# Patient Record
Sex: Female | Born: 1960 | Race: White | Hispanic: Yes | Marital: Married | Smoking: Former smoker
Health system: Southern US, Community
[De-identification: ages and names within clinical notes are randomized; demographics above are authoritative.]

## PROBLEM LIST (undated history)

## (undated) DIAGNOSIS — H409 Unspecified glaucoma: Secondary | ICD-10-CM

## (undated) DIAGNOSIS — I10 Essential (primary) hypertension: Secondary | ICD-10-CM

## (undated) DIAGNOSIS — E119 Type 2 diabetes mellitus without complications: Secondary | ICD-10-CM

## (undated) DIAGNOSIS — M1990 Unspecified osteoarthritis, unspecified site: Secondary | ICD-10-CM

## (undated) DIAGNOSIS — N939 Abnormal uterine and vaginal bleeding, unspecified: Secondary | ICD-10-CM

## (undated) DIAGNOSIS — C55 Malignant neoplasm of uterus, part unspecified: Secondary | ICD-10-CM

## (undated) HISTORY — DX: Essential (primary) hypertension: I10

## (undated) HISTORY — DX: Unspecified glaucoma: H40.9

## (undated) HISTORY — PX: BREAST BIOPSY: SHX20

## (undated) HISTORY — DX: Type 2 diabetes mellitus without complications: E11.9

## (undated) HISTORY — PX: HYSTEROSCOPY W/ POLYPECTOMY: SHX1775

## (undated) HISTORY — PX: OTHER PROCEDURE: U1053

## (undated) HISTORY — PX: COLONOSCOPY: SHX174

## (undated) HISTORY — DX: Abnormal uterine and vaginal bleeding, unspecified: N93.9

## (undated) HISTORY — PX: INCISIONAL BREAST BIOPSY: SHX1812

## (undated) HISTORY — DX: Type 2 diabetes mellitus without complications (CMS-HCC): E11.9

## (undated) HISTORY — DX: Unspecified osteoarthritis, unspecified site: M19.90

## (undated) HISTORY — DX: Malignant neoplasm of uterus, part unspecified (CMS-HCC): C55

## (undated) MED ORDER — METFORMIN HCL 500 MG OR TB24
500.00 mg | ORAL_TABLET | Freq: Every day | ORAL | 0 refills | Status: AC
Start: 2018-10-08 — End: ?

## (undated) MED ORDER — LISINOPRIL-HYDROCHLOROTHIAZIDE 20-25 MG OR TABS
1.0000 | ORAL_TABLET | Freq: Every day | ORAL | 2 refills | Status: AC
Start: 2019-04-09 — End: ?

---

## 2016-08-14 ENCOUNTER — Ambulatory Visit (INDEPENDENT_AMBULATORY_CARE_PROVIDER_SITE_OTHER): Admitting: Family Practice

## 2016-08-18 ENCOUNTER — Ambulatory Visit (INDEPENDENT_AMBULATORY_CARE_PROVIDER_SITE_OTHER): Admitting: Medical

## 2016-08-18 ENCOUNTER — Ambulatory Visit (INDEPENDENT_AMBULATORY_CARE_PROVIDER_SITE_OTHER): Admitting: Family

## 2016-08-18 ENCOUNTER — Ambulatory Visit (INDEPENDENT_AMBULATORY_CARE_PROVIDER_SITE_OTHER): Admitting: Family Practice

## 2016-08-18 ENCOUNTER — Encounter (INDEPENDENT_AMBULATORY_CARE_PROVIDER_SITE_OTHER): Payer: Self-pay | Admitting: Family

## 2016-08-18 VITALS — BP 132/82 | HR 72 | Temp 97.9°F | Resp 16 | Ht 61.5 in | Wt 268.2 lb

## 2016-08-18 DIAGNOSIS — Z13228 Encounter for screening for other metabolic disorders: Secondary | ICD-10-CM

## 2016-08-18 DIAGNOSIS — Z1211 Encounter for screening for malignant neoplasm of colon: Principal | ICD-10-CM

## 2016-08-18 DIAGNOSIS — I10 Essential (primary) hypertension: Secondary | ICD-10-CM

## 2016-08-18 LAB — CBC WITH DIFF, BLOOD
Abs Basophils: 41 cells/uL (ref 0–200)
Abs Eosinophils: 143 cells/uL (ref 15–500)
Abs Lymphs: 2285 cells/uL (ref 850–3900)
Abs Monocytes: 704 cells/uL (ref 200–950)
Abs NRBC: 0 cells/uL
Abs Neutrophils: 7028 cells/uL (ref 1500–7800)
Basophils: 0.4 %
Eosinophils: 1.4 %
HCT: 41.8 % (ref 35.0–45.0)
HGB: 14.2 g/dL (ref 11.7–15.5)
Lymps: 22.4 %
MCH: 28.9 pg (ref 27.0–33.0)
MCHC: 34 g/dL (ref 32.0–36.0)
MCV: 85 fL (ref 80.0–100.0)
MPV: 11.7 fL (ref 7.5–12.5)
Monocytes: 6.9 %
PLT: 229 10*3/uL (ref 140–400)
RBC: 4.92 10*6/uL (ref 3.80–5.10)
RDW: 12.5 % (ref 11.0–15.0)
SEGS: 68.9 %
WBC: 10.2 10*3/uL (ref 3.8–10.8)

## 2016-08-18 LAB — COMPREHENSIVE METABOLIC PANEL, BLOOD
ALT (SGPT): 43 U/L — ABNORMAL HIGH (ref 6–29)
AST (SGOT): 34 U/L (ref 10–35)
Albumin/Glob Ratio: 1.4 (calc) (ref 1.0–2.5)
Albumin: 4.1 g/dL (ref 3.6–5.1)
Alkaline Phos: 66 U/L (ref 33–130)
BUN: 9 mg/dL (ref 7–25)
Bilirubin, Total: 0.6 mg/dL (ref 0.2–1.2)
Calcium: 9.4 mg/dL (ref 8.6–10.4)
Carbon Dioxide: 30 mmol/L (ref 20–31)
Chloride: 102 mmol/L (ref 98–110)
Creatinine: 0.67 mg/dL (ref 0.50–1.05)
Globulin: 2.9 g/dL (calc) (ref 1.9–3.7)
Glucose: 244 mg/dL — ABNORMAL HIGH (ref 65–99)
Potassium: 4 mmol/L (ref 3.5–5.3)
Sodium: 139 mmol/L (ref 135–146)
Total Protein: 7 g/dL (ref 6.1–8.1)
eGFR African American: 115 mL/min/{1.73_m2} (ref >=60)
eGFR non-Afr.American: 99 mL/min/{1.73_m2} (ref >=60)

## 2016-08-18 LAB — LIPID(CHOL FRACT) PANEL, BLOOD
Chol/HDLC Ratio: 3.2 (calc) (ref <5.0)
Cholesterol: 145 mg/dL (ref <200)
HDL Cholesterol: 45 mg/dL — ABNORMAL LOW (ref >50)
LDL-Cholesterol: 84 mg/dL (calc)
Non-HDL Cholesterol: 100 mg/dL (calc) (ref <130)
Triglycerides: 70 mg/dL (ref <150)

## 2016-08-18 MED ORDER — LISINOPRIL-HYDROCHLOROTHIAZIDE 20-25 MG OR TABS
1.0000 | ORAL_TABLET | Freq: Every day | ORAL | 5 refills | Status: DC
Start: 2016-08-18 — End: 2017-02-08

## 2016-08-18 MED ORDER — LISINOPRIL-HYDROCHLOROTHIAZIDE 20-25 MG OR TABS
ORAL_TABLET | ORAL | 2 refills | Status: DC
Start: 2016-07-15 — End: 2016-08-18

## 2016-08-19 ENCOUNTER — Encounter (INDEPENDENT_AMBULATORY_CARE_PROVIDER_SITE_OTHER): Payer: Self-pay | Admitting: Family

## 2016-08-19 DIAGNOSIS — R899 Unspecified abnormal finding in specimens from other organs, systems and tissues: Principal | ICD-10-CM

## 2016-08-21 ENCOUNTER — Telehealth (INDEPENDENT_AMBULATORY_CARE_PROVIDER_SITE_OTHER): Payer: Self-pay | Admitting: Family

## 2016-08-29 ENCOUNTER — Ambulatory Visit (INDEPENDENT_AMBULATORY_CARE_PROVIDER_SITE_OTHER): Admitting: Family Medicine

## 2016-09-02 LAB — COMPREHENSIVE METABOLIC PANEL, BLOOD
ALT (SGPT): 60 U/L — ABNORMAL HIGH (ref 6–29)
AST (SGOT): 53 U/L — ABNORMAL HIGH (ref 10–35)
Albumin/Glob Ratio: 1.5 (calc) (ref 1.0–2.5)
Albumin: 4.3 g/dL (ref 3.6–5.1)
Alkaline Phos: 60 U/L (ref 33–130)
BUN: 12 mg/dL (ref 7–25)
Bilirubin, Total: 0.8 mg/dL (ref 0.2–1.2)
Calcium: 9.6 mg/dL (ref 8.6–10.4)
Carbon Dioxide: 26 mmol/L (ref 20–32)
Chloride: 99 mmol/L (ref 98–110)
Creatinine: 0.66 mg/dL (ref 0.50–1.05)
Globulin: 2.9 g/dL (calc) (ref 1.9–3.7)
Glucose: 146 mg/dL — ABNORMAL HIGH (ref 65–99)
Potassium: 4.1 mmol/L (ref 3.5–5.3)
Sodium: 137 mmol/L (ref 135–146)
Total Protein: 7.2 g/dL (ref 6.1–8.1)
eGFR African American: 115 mL/min/{1.73_m2} (ref >=60)
eGFR non-Afr.American: 99 mL/min/{1.73_m2} (ref >=60)

## 2016-09-02 LAB — GLYCOSYLATED HGB(A1C), BLOOD: Hgb A1C: 9.3 % of total Hgb — ABNORMAL HIGH (ref <5.7)

## 2016-09-02 LAB — RANDOM URINE MICROALBUMIN: Microalbumin Random: 1 mg/dL

## 2016-09-02 LAB — STOOL IMMUNOCHEMICAL OCCULT BLOOD

## 2016-09-04 ENCOUNTER — Telehealth (INDEPENDENT_AMBULATORY_CARE_PROVIDER_SITE_OTHER): Payer: Self-pay | Admitting: Family

## 2016-09-05 ENCOUNTER — Telehealth (INDEPENDENT_AMBULATORY_CARE_PROVIDER_SITE_OTHER): Payer: Self-pay | Admitting: Family

## 2016-09-12 ENCOUNTER — Ambulatory Visit (INDEPENDENT_AMBULATORY_CARE_PROVIDER_SITE_OTHER): Admitting: Family

## 2016-09-16 ENCOUNTER — Ambulatory Visit (INDEPENDENT_AMBULATORY_CARE_PROVIDER_SITE_OTHER): Admitting: Family

## 2016-09-16 VITALS — BP 142/78 | HR 78 | Temp 98.4°F | Resp 16 | Ht 61.5 in | Wt 261.0 lb

## 2016-09-16 DIAGNOSIS — E119 Type 2 diabetes mellitus without complications: Principal | ICD-10-CM

## 2016-09-16 DIAGNOSIS — I10 Essential (primary) hypertension: Secondary | ICD-10-CM

## 2016-09-16 MED ORDER — METFORMIN HCL 500 MG OR TABS
500.0000 mg | ORAL_TABLET | Freq: Two times a day (BID) | ORAL | 5 refills | Status: DC
Start: 2016-09-16 — End: 2016-12-22

## 2016-09-16 NOTE — Progress Notes (Signed)
Encounter Date:  09/16/16  10:10 AM   PCP: Michaelle Copas  MRN: [id number]        HPI:  Kathleen May   is a 56 year old year old (10-Oct-1960)  female who presents  for the following:   Chief Complaint   Patient presents with   . Results     discuss lab results           PROBLEM  LIST:  Patient Active Problem List   Diagnosis   . Essential hypertension   . Type 2 diabetes mellitus without complication, without long-term current use of insulin (CMS-HCC)   . Type 2 diabetes mellitus without complication (CMS-HCC)         PAST MEDICAL HISTORY:  Past Medical History:   Diagnosis Date   . Arthritis    . Hypertension        PAST SURGICAL HISTORY:  Past Surgical History:   Procedure Laterality Date   . CESAREAN SECTION, CLASSIC  1982    2nd 1989        FAMILY HISTORY:   Family History   Problem Relation Age of Onset   . Hypertension Mother    . Diabetes Mother    . Other Mother    . Liver Disease Father    . Cancer Father    . Cancer Maternal Grandmother    . Cancer Maternal Aunt          SOCIAL HISTORY:  Social History     Social History   . Marital status: Married     Spouse name: N/A   . Number of children: N/A   . Years of education: N/A     Occupational History   . Not on file.     Social History Main Topics   . Smoking status: Former Smoker     Packs/day: 2.00     Quit date: 2008   . Smokeless tobacco: Never Used   . Alcohol use No   . Drug use: Not on file   . Sexual activity: Not on file     Other Topics Concern   . Not on file     Social History Narrative   . No narrative on file     History   Smoking Status   . Former Smoker   . Packs/day: 2.00   . Quit date: 2008   Smokeless Tobacco   . Never Used      History   Alcohol Use No      History   Drug Use Not on file          There is no immunization history on file for this patient.      CURRENT  MEDICATIONS:  Current Outpatient Prescriptions on File Prior to Visit   Medication Sig Dispense Refill   . lisinopril-hydrochlorothiazide (ZESTORETIC) 20-25 MG tablet Take  1 tablet by mouth daily. 30 tablet 5     No current facility-administered medications on file prior to visit.      No outpatient prescriptions have been marked as taking for the 09/16/16 encounter (Office Visit) with Cecille Amsterdam, NP.        ALLERGIES:    Allergies   Allergen Reactions   . Codeine Other          REVIEW OF SYSTEMS:  Review of Systems   Constitutional: Negative.  Negative for activity change, chills and fever.   HENT: Negative.  Negative for congestion and trouble swallowing.  Eyes: Negative.  Negative for visual disturbance.   Respiratory: Negative for cough and shortness of breath.    Cardiovascular: Negative for chest pain.   Gastrointestinal: Negative for abdominal pain.   Endocrine: Negative for cold intolerance.   Genitourinary: Negative for dysuria.   Musculoskeletal: Negative for arthralgias.   Skin: Negative for rash.   Neurological: Negative for speech difficulty.   Hematological: Negative for adenopathy.   Psychiatric/Behavioral: Negative for behavioral problems.         PHYSICAL EXAM:   09/16/16  0956   BP: 142/78   Pulse: 78   Resp: 16   Temp: 98.4 F (36.9 C)     Body mass index is 48.52 kg/(m^2).   Height: 5' 1.5" (156.2 cm)   Weight: 118.4 kg (261 lb)     Physical Exam   Constitutional: She is oriented to person, place, and time. She appears well-developed and well-nourished.   Cardiovascular: Normal rate, regular rhythm and normal heart sounds.    Pulmonary/Chest: Effort normal.   Neurological: She is alert and oriented to person, place, and time.   Psychiatric: She has a normal mood and affect. Her behavior is normal.           ASSESSMENT & PLAN:    Type 2 diabetes mellitus without complication, without long-term current use of insulin (CMS-HCC)  Stable  Patient must continue aggressive control of blood sugar and blood pressure by taking current meds as listed above  Continue to monitor and f/u for blood work  Patient was counseled regarding the need to increase exercise and to  monitor diet:   Consider low carbohydrate or Paleo/Keto diet.   Lab Results   Component Value Date    A1C 9.3 (H) 08/29/2016       Essential hypertension   Stable   Take meds as directed   Consider checking  blood pressure at home  regularly and record progression in a journal   Increase exercise and to monitor diet   F/UP IN 4-6 months or as directed        ORDERS PLACED DURING VISITS  [name] had no medications administered during this visit.  Orders Placed This Encounter   Procedures   . Comprehensive Metabolic Panel Green   . Glycosylated Hgb(A1C), Blood Lavender   . Random Urine Microalbumin w/o Creatinine     No orders of the defined types were placed in this encounter.    No orders of the defined types were placed in this encounter.              ICD-10 codes:    ICD-10-CM ICD-9-CM    1. Type 2 diabetes mellitus without complication, without long-term current use of insulin (CMS-HCC) E11.9 250.00 metFORMIN (GLUCOPHAGE) 500 MG tablet      Comprehensive Metabolic Panel Green      Glycosylated Hgb(A1C), Blood Lavender      Random Urine Microalbumin w/o Creatinine      Consult/Referral to Ophthalmology Clinic   2. Essential hypertension I10 401.9            Cecille Amsterdam, NP  [facility]  [email or web address]

## 2016-09-16 NOTE — Assessment & Plan Note (Signed)
Stable  Patient must continue aggressive control of blood sugar and blood pressure by taking current meds as listed above  Continue to monitor and f/u for blood work  Patient was counseled regarding the need to increase exercise and to monitor diet:   Consider low carbohydrate or Paleo/Keto diet.   Lab Results   Component Value Date    A1C 9.3 (H) 08/29/2016

## 2016-09-17 NOTE — Assessment & Plan Note (Signed)
Stable  Take meds as directed  Consider checking  blood pressure at home  regularly and record progression in a journal  Increase exercise and to monitor diet  F/UP IN 4-6 months or as directed

## 2016-10-04 ENCOUNTER — Ambulatory Visit (INDEPENDENT_AMBULATORY_CARE_PROVIDER_SITE_OTHER): Admitting: Family Medicine

## 2016-11-07 ENCOUNTER — Ambulatory Visit (INDEPENDENT_AMBULATORY_CARE_PROVIDER_SITE_OTHER): Admitting: Family Medicine

## 2016-11-07 DIAGNOSIS — Z23 Encounter for immunization: Principal | ICD-10-CM

## 2016-11-08 ENCOUNTER — Encounter (INDEPENDENT_AMBULATORY_CARE_PROVIDER_SITE_OTHER): Payer: Self-pay

## 2016-12-19 LAB — COMPREHENSIVE METABOLIC PANEL, BLOOD
ALT (SGPT): 17 U/L (ref 6–29)
AST (SGOT): 22 U/L (ref 10–35)
Albumin/Glob Ratio: 1.4 (calc) (ref 1.0–2.5)
Albumin: 4.4 g/dL (ref 3.6–5.1)
Alkaline Phos: 67 U/L (ref 33–130)
BUN: 17 mg/dL (ref 7–25)
Bilirubin, Total: 0.6 mg/dL (ref 0.2–1.2)
Calcium: 9.8 mg/dL (ref 8.6–10.4)
Carbon Dioxide: 31 mmol/L (ref 20–32)
Chloride: 103 mmol/L (ref 98–110)
Creatinine: 0.61 mg/dL (ref 0.50–1.05)
Globulin: 3.2 g/dL (calc) (ref 1.9–3.7)
Glucose: 98 mg/dL (ref 65–99)
Potassium: 4.1 mmol/L (ref 3.5–5.3)
Sodium: 142 mmol/L (ref 135–146)
Total Protein: 7.6 g/dL (ref 6.1–8.1)
eGFR African American: 118 mL/min/{1.73_m2} (ref >=60)
eGFR non-Afr.American: 102 mL/min/{1.73_m2} (ref >=60)

## 2016-12-19 LAB — GLYCOSYLATED HGB(A1C), BLOOD: Hgb A1C: 5.2 % of total Hgb (ref <5.7)

## 2016-12-19 LAB — RANDOM URINE MICROALBUMIN: Microalbumin Random: 1.2 mg/dL

## 2016-12-20 ENCOUNTER — Encounter (INDEPENDENT_AMBULATORY_CARE_PROVIDER_SITE_OTHER): Payer: Self-pay | Admitting: Family

## 2016-12-21 ENCOUNTER — Encounter (INDEPENDENT_AMBULATORY_CARE_PROVIDER_SITE_OTHER): Payer: Self-pay | Admitting: Family

## 2016-12-22 ENCOUNTER — Ambulatory Visit (INDEPENDENT_AMBULATORY_CARE_PROVIDER_SITE_OTHER): Admitting: Family

## 2016-12-22 ENCOUNTER — Encounter (INDEPENDENT_AMBULATORY_CARE_PROVIDER_SITE_OTHER): Payer: Self-pay | Admitting: Physician Assistant

## 2016-12-22 ENCOUNTER — Ambulatory Visit (INDEPENDENT_AMBULATORY_CARE_PROVIDER_SITE_OTHER): Admitting: Physician Assistant

## 2016-12-22 VITALS — BP 138/76 | HR 74 | Temp 98.0°F | Resp 16 | Ht 61.0 in | Wt 241.0 lb

## 2016-12-22 DIAGNOSIS — E119 Type 2 diabetes mellitus without complications: Secondary | ICD-10-CM

## 2016-12-22 DIAGNOSIS — I10 Essential (primary) hypertension: Secondary | ICD-10-CM

## 2016-12-22 DIAGNOSIS — Z712 Person consulting for explanation of examination or test findings: Principal | ICD-10-CM

## 2016-12-22 DIAGNOSIS — M2550 Pain in unspecified joint: Secondary | ICD-10-CM

## 2016-12-22 DIAGNOSIS — L853 Xerosis cutis: Secondary | ICD-10-CM

## 2016-12-22 MED ORDER — METFORMIN HCL 500 MG OR TB24
500.0000 mg | ORAL_TABLET | Freq: Every day | ORAL | 1 refills | Status: DC
Start: 2016-12-22 — End: 2017-07-12

## 2016-12-22 NOTE — Assessment & Plan Note (Signed)
Chronic, worsening since starting to exercise on the treadmill the last few months.  Pt can continue Ibuprofen as needed. Can alternate with Tylenol. Discussed negative side effects of using these medications in excess.  Also recommend adding Turmeric and Glucosamine.  Recommend different exercises such as swimming, biking or elliptical which are easier on the joints.

## 2016-12-22 NOTE — Assessment & Plan Note (Addendum)
Active x a couple of months.  Pt can continue Lanolin cream if this helps.  Also recommended trying OTC Hydrocortisone cream for the next 1-2 weeks. Discussed negative side effects of long term corticosteroid use.    F/u if symptoms worsen or do not improve.

## 2016-12-22 NOTE — Assessment & Plan Note (Signed)
Stable.  Patient must continue aggressive control of blood sugar and blood pressure by taking current meds as listed above.  Continue to monitor and f/u for blood work: CMP, HgbA1C and microalbumin ordered and to be completed in 6 months.   Patient was counseled regarding the need to increase exercise and to monitor diet:   Continue paleo diet.   08/29/2016:  HgbA1C: 9.3  Glucose: 146    12/18/2016:  HgbA1C: 5.2  Glucose: 98  Improved. Pt requests lowering Metformin dose as she wants to minimize the meds she is on. Prescribe Metformin XR to be taken once daily. Will recheck labs in 6 months.   Recommend monitoring blood sugar at home.  F/u with any concerns.

## 2016-12-22 NOTE — Assessment & Plan Note (Addendum)
Discussed most recent labs with pt in detail, compared to labs on 08/30/2106, showing improvement.  12/18/2016:  HgbA1C: 5.2  Glucose: 98  AST: 22  ALT: 17

## 2016-12-22 NOTE — Assessment & Plan Note (Signed)
138/76 in clinic today. Pt has not yet taken her Lisinopril-HCTZ.  Continue medication as prescribed.  Pt wants to eventually wean off this medication.  Pt instructed to check blood pressure at home for the next 1 week and record progression in a journal. If BP remains low, can call clinic and consider lowering med dose.   Increase exercise and monitor diet.  F/up as directed.

## 2016-12-22 NOTE — Progress Notes (Signed)
Encounter Date:  12/22/16  10:24 AM   PCP: Michaelle Copas  MRN: [id number]    HPI:  Kathleen May is a 56 year old female who presents to discuss lab results. Pt had blood work done on 08/29/2016 and was seen in clinic on 09/16/2016 at which time she was diagnosed with DM and started on 500 mg po bid Metformin.   08/29/2016:  HgbA1C: 9.3  Glucose: 146  AST: 53  ALT: 60  She endorses experiencing constipation then diarrhea for the first 2 weeks after starting the medications but has not had any problems since then. She has been doing the [employer] and exercising on the treadmill since August 2018 and reports weight loss secondary to the same. She requests to lower her dose of Metformin.   Pt had repeat lab work recently.   12/18/2016:  HgbA1C: 5.2  Glucose: 98  AST: 22  ALT: 17  Pt has been compliant with her BP medication Lisinopril-HCTZ. Pt has not yet taken her BP meds this morning. She has not been monitoring her BP at home. She requests to lower the dose and eventually wants to wean off. Pt endorses nocturia secondary to the medication.     Pt further reports dry skin, especially on her fingers, for the last few months for which she uses Lanolin cream.  Pt has also been experiencing increased joint pain, especially involving her knees, for the last few months. Pt takes 800 mg Ibuprofen as needed for the same. She does endorse a hx of arthritis in her knee diagnosed via x-ray.  Pt denies fever, chills, fatigue, shortness of breath,t chest pain, vomiting, lightheadedness or any other associated symptoms or complaints.    PROBLEM  LIST:  Patient Active Problem List   Diagnosis   . Essential hypertension   . Type 2 diabetes mellitus without complication, without long-term current use of insulin (CMS-HCC)   . Type 2 diabetes mellitus without complication (CMS-HCC)   . Encounter to discuss test results   . Arthralgia, unspecified joint   . Hypertension, unspecified type   . Dry skin         PAST MEDICAL HISTORY:  Past  Medical History:   Diagnosis Date   . Arthritis    . Hypertension        PAST SURGICAL HISTORY:  Past Surgical History:   Procedure Laterality Date   . CESAREAN SECTION, CLASSIC  1982    2nd 1989        FAMILY HISTORY:   Family History   Problem Relation Age of Onset   . Hypertension Mother    . Diabetes Mother    . Other Mother    . Liver Disease Father    . Cancer Father    . Cancer Maternal Grandmother    . Cancer Maternal Aunt          SOCIAL HISTORY:  Social History     Social History   . Marital status: Married     Spouse name: N/A   . Number of children: N/A   . Years of education: N/A     Occupational History   . Not on file.     Social History Main Topics   . Smoking status: Former Smoker     Packs/day: 2.00     Quit date: 2008   . Smokeless tobacco: Never Used   . Alcohol use No   . Drug use: Not on file   . Sexual activity: Not on  file     Other Topics Concern   . Not on file     Social History Narrative     History   Smoking Status   . Former Smoker   . Packs/day: 2.00   . Quit date: 2008   Smokeless Tobacco   . Never Used      History   Alcohol Use No      History   Drug Use Not on file        Immunization History   Administered Date(s) Administered   . Influenza Vaccine >=3 Years 11/07/2016         CURRENT  MEDICATIONS:  Current Outpatient Prescriptions on File Prior to Visit   Medication Sig Dispense Refill   . lisinopril-hydrochlorothiazide (ZESTORETIC) 20-25 MG tablet Take 1 tablet by mouth daily. 30 tablet 5   . [DISCONTINUED] metFORMIN (GLUCOPHAGE) 500 MG tablet Take 1 tablet (500 mg) by mouth 2 times daily (with meals). 60 tablet 5     No current facility-administered medications on file prior to visit.      Outpatient Prescriptions Marked as Taking for the 12/22/16 encounter (Office Visit) with Danton Sewer, PA   Medication Sig Dispense Refill   . lisinopril-hydrochlorothiazide (ZESTORETIC) 20-25 MG tablet Take 1 tablet by mouth daily. 30 tablet 5        ALLERGIES:    Allergies   Allergen  Reactions   . Codeine Other          REVIEW OF SYSTEMS:  Review of Systems   Constitutional: Negative for chills, fatigue and fever.   Respiratory: Negative for shortness of breath.    Cardiovascular: Negative for chest pain and leg swelling.   Gastrointestinal: Negative for abdominal pain, constipation, diarrhea and vomiting.   Endocrine:        Nocturia.   Genitourinary: Negative for decreased urine volume.   Musculoskeletal: Positive for arthralgias.   Skin: Negative for rash.   Neurological: Negative for dizziness, light-headedness and headaches.         PHYSICAL EXAM:   12/22/16  0945   BP: 138/76   Pulse: 74   Resp: 16   Temp: 98 F (36.7 C)   SpO2: 98%     Body mass index is 45.54 kg/(m^2).    Physical Exam   Constitutional: She is oriented to person, place, and time. She appears well-developed and well-nourished. No distress.   HENT:   Head: Normocephalic and atraumatic.   Eyes: Pupils are equal, round, and reactive to light. EOM are normal.   Neck: Normal range of motion.   Cardiovascular: Normal rate and regular rhythm.    Pulmonary/Chest: Effort normal and breath sounds normal.   Abdominal: She exhibits no distension.   Musculoskeletal: Normal range of motion.   Neurological: She is alert and oriented to person, place, and time.   Skin: Skin is warm and dry. She is not diaphoretic.   Dry cracked skin noted to fingertips. Sebaceous cyst noted to right upper arm.    Psychiatric: She has a normal mood and affect. Her behavior is normal. Judgment and thought content normal.   Nursing note and vitals reviewed.          ASSESSMENT & PLAN:  Kathleen May was seen today for follow up results and medication review.    Diagnoses and associated orders for this visit:    Encounter to discuss test results  Assessment & Plan:  Discussed most recent labs with pt in detail, compared to labs on  08/30/2106, showing improvement.  12/18/2016:  HgbA1C: 5.2  Glucose: 98  AST: 22  ALT: 17      Type 2 diabetes mellitus without  complication, without long-term current use of insulin (CMS-HCC)  Assessment & Plan:  Stable.  Patient must continue aggressive control of blood sugar and blood pressure by taking current meds as listed above.  Continue to monitor and f/u for blood work: CMP, HgbA1C and microalbumin ordered and to be completed in 6 months.   Patient was counseled regarding the need to increase exercise and to monitor diet:   Continue paleo diet.   08/29/2016:  HgbA1C: 9.3  Glucose: 146    12/18/2016:  HgbA1C: 5.2  Glucose: 98  Improved. Pt requests lowering Metformin dose as she wants to minimize the meds she is on. Prescribe Metformin XR to be taken once daily. Will recheck labs in 6 months.   Recommend monitoring blood sugar at home.  F/u with any concerns.     Orders:  -     metFORMIN (GLUCOPHAGE XR) 500 MG XR tablet; Take 1 tablet (500 mg) by mouth daily.  -     Comprehensive Metabolic Panel Green; Future  -     Glycosylated Hgb(A1C), Blood Lavender; Future  -     Random Urine Microalb/Creat Ratio Panel; Future    Hypertension, unspecified type  Assessment & Plan:  138/76 in clinic today. Pt has not yet taken her Lisinopril-HCTZ.  Continue medication as prescribed.  Pt wants to eventually wean off this medication.  Pt instructed to check blood pressure at home for the next 1 week and record progression in a journal. If BP remains low, can call clinic and consider lowering med dose.   Increase exercise and monitor diet.  F/up as directed.        Arthralgia, unspecified joint  Assessment & Plan:  Chronic, worsening since starting to exercise on the treadmill the last few months.  Pt can continue Ibuprofen as needed. Can alternate with Tylenol. Discussed negative side effects of using these medications in excess.  Also recommend adding Turmeric and Glucosamine.  Recommend different exercises such as swimming, biking or elliptical which are easier on the joints.       Dry skin  Assessment & Plan:  Active x a couple of months.  Pt can  continue Lanolin cream if this helps.  Also recommended trying OTC Hydrocortisone cream for the next 1-2 weeks. Discussed negative side effects of long term corticosteroid use.    F/u if symptoms worsen or do not improve.           ICD-10-CM ICD-9-CM    1. Encounter to discuss test results Z71.2 V65.49    2. Type 2 diabetes mellitus without complication, without long-term current use of insulin (CMS-HCC) E11.9 250.00 metFORMIN (GLUCOPHAGE XR) 500 MG XR tablet      Comprehensive Metabolic Panel Green      Glycosylated Hgb(A1C), Blood Lavender      Random Urine Microalb/Creat Ratio Panel   3. Hypertension, unspecified type I10 401.9    4. Arthralgia, unspecified joint M25.50 719.40    5. Dry skin L85.3 701.1          No future appointments.        I, [employer], have been present through this entire encounter,  I have reviewed and verified the HPI, the review of systems and past/family and medical history as documented by Jeralyn Bennett, PA-S, who participated in this service.  I have  re-examined the patient after him and have verified the documentation of the exam and amended as needed and I personally dictated the medical decision making for this encounter.       [facility]  [email or web address]

## 2017-02-05 ENCOUNTER — Encounter (INDEPENDENT_AMBULATORY_CARE_PROVIDER_SITE_OTHER): Payer: Self-pay

## 2017-02-08 ENCOUNTER — Other Ambulatory Visit (INDEPENDENT_AMBULATORY_CARE_PROVIDER_SITE_OTHER): Payer: Self-pay | Admitting: Physician Assistant

## 2017-02-08 MED ORDER — LISINOPRIL-HYDROCHLOROTHIAZIDE 20-25 MG OR TABS
1.0000 | ORAL_TABLET | Freq: Every day | ORAL | 5 refills | Status: DC
Start: 2017-02-08 — End: 2017-07-12

## 2017-05-31 ENCOUNTER — Encounter (INDEPENDENT_AMBULATORY_CARE_PROVIDER_SITE_OTHER): Payer: Self-pay | Admitting: Physician Assistant

## 2017-05-31 ENCOUNTER — Ambulatory Visit (INDEPENDENT_AMBULATORY_CARE_PROVIDER_SITE_OTHER): Admitting: Physician Assistant

## 2017-05-31 VITALS — BP 140/92 | HR 76 | Temp 98.3°F | Resp 16 | Ht 61.0 in | Wt 261.4 lb

## 2017-05-31 DIAGNOSIS — N950 Postmenopausal bleeding: Principal | ICD-10-CM

## 2017-05-31 DIAGNOSIS — M25531 Pain in right wrist: Secondary | ICD-10-CM

## 2017-05-31 DIAGNOSIS — N949 Unspecified condition associated with female genital organs and menstrual cycle: Secondary | ICD-10-CM

## 2017-05-31 LAB — HGB (POCT) BLOOD: Hgb (POCT): 12.5 gm/dL (ref 11.2–15.7)

## 2017-05-31 NOTE — Assessment & Plan Note (Addendum)
Active  Order transvaginal/pelvic u/s  Stop decrease ibuprofen use  Monitor symptoms   PAP overdue - advised to schedule appointment

## 2017-05-31 NOTE — Assessment & Plan Note (Signed)
Active  Overuse vs carpel tunnel  Order xray  Referral to ortho

## 2017-05-31 NOTE — Progress Notes (Signed)
Encounter Date:  05/31/17  8:28 AM   PCP: Michaelle Copas  MRN: [id number]  AGE: [birth year]        HPI:  Kathleen May is a 57 year old female presents c/o vaginal bleeding x 2 months  Pt reports no period since 2015;   Pt reports she noticed spotting x 2 months ago every time after sex at first then light spotting when wiping only.    Yesterday had mild cramps and gushed blood, about a cup of blood then a clot the size of a hand. Pt states she kept bleeding in the shower and then used a normal pad which she bleed thru in 2 hours.   Pt states she kept bleeding and worsened with movement - currently bleeding very lightly    Pt took one 800mg  ibuprofen yesterday due to the cramping - denies cramping today. Pt denies continuously taking ibuprofen daily. However, pt did take ibuprofen twice last week due to wrist pain.    Pt also c/o recurrent right wrist pain after working on blanket x 5 days. Pt states her hand cramped up and could not move it. Pt states she had this problem before but never this bad since she also has numbness in thumb. Pt states she has been holding off from seeing a specialist since she is concerned about surgery.     Last Pap: over 5-10 per pt, but normal. Pt does not want to complete PAP today.  Pt denies using FB, tampons.  Pt denies dizziness, weakness.     PROBLEM  LIST:  Patient Active Problem List   Diagnosis   . Essential hypertension   . Type 2 diabetes mellitus without complication, without long-term current use of insulin (CMS-HCC)   . Type 2 diabetes mellitus without complication (CMS-HCC)   . Encounter to discuss test results   . Arthralgia, unspecified joint   . Hypertension, unspecified type   . Dry skin   . Postmenopausal bleeding   . Right wrist pain         PAST MEDICAL HISTORY:  Past Medical History:   Diagnosis Date   . Arthritis    . Hypertension        PAST SURGICAL HISTORY:  Past Surgical History:   Procedure Laterality Date   . CESAREAN SECTION, CLASSIC  1982    2nd 1989        FAMILY HISTORY:   Family History   Problem Relation Name Age of Onset   . Hypertension Mother     . Diabetes Mother     . Other Mother     . Liver Disease Father     . Cancer Father     . Cancer Maternal Grandmother     . Cancer Maternal Aunt           SOCIAL HISTORY:  Social History     Socioeconomic History   . Marital status: Married     Spouse name: Not on file   . Number of children: Not on file   . Years of education: Not on file   . Highest education level: Not on file   Occupational History   . Not on file   Social Needs   . Financial resource strain: Not on file   . Food insecurity:     Worry: Not on file     Inability: Not on file   . Transportation needs:     Medical: Not on file     Non-medical: Not  on file   Tobacco Use   . Smoking status: Former Smoker     Packs/day: 2.00     Last attempt to quit: 2008     Years since quitting: 11.3   . Smokeless tobacco: Never Used   Substance and Sexual Activity   . Alcohol use: No   . Drug use: Not on file   . Sexual activity: Not on file   Lifestyle   . Physical activity:     Days per week: Not on file     Minutes per session: Not on file   . Stress: Not on file   Relationships   . Social connections:     Talks on phone: Not on file     Gets together: Not on file     Attends religious service: Not on file     Active member of club or organization: Not on file     Attends meetings of clubs or organizations: Not on file     Relationship status: Not on file   . Intimate partner violence:     Fear of current or ex partner: Not on file     Emotionally abused: Not on file     Physically abused: Not on file     Forced sexual activity: Not on file   Other Topics Concern   . Not on file   Social History Narrative   . Not on file     Social History     Tobacco Use   Smoking Status Former Smoker   . Packs/day: 2.00   . Last attempt to quit: 2008   . Years since quitting: 11.3   Smokeless Tobacco Never Used      Social History     Substance and Sexual Activity   Alcohol  Use No      Social History     Substance and Sexual Activity   Drug Use Not on file        Immunization History   Administered Date(s) Administered   . Influenza Vaccine >=3 Years 11/07/2016         CURRENT  MEDICATIONS:  Current Outpatient Medications on File Prior to Visit   Medication Sig Dispense Refill   . lisinopril-hydrochlorothiazide (ZESTORETIC) 20-25 MG tablet Take 1 tablet by mouth daily. 30 tablet 5   . metFORMIN (GLUCOPHAGE XR) 500 MG XR tablet Take 1 tablet (500 mg) by mouth daily. 90 tablet 1     No current facility-administered medications on file prior to visit.      Outpatient Medications Marked as Taking for the 05/31/17 encounter (Office Visit) with Danton Sewer, PA   Medication Sig Dispense Refill   . lisinopril-hydrochlorothiazide (ZESTORETIC) 20-25 MG tablet Take 1 tablet by mouth daily. 30 tablet 5   . metFORMIN (GLUCOPHAGE XR) 500 MG XR tablet Take 1 tablet (500 mg) by mouth daily. 90 tablet 1        ALLERGIES:    Allergies   Allergen Reactions   . Codeine Other          REVIEW OF SYSTEMS:  Review of Systems   Constitutional: Negative for activity change, fatigue and fever.   Respiratory: Negative for chest tightness and shortness of breath.    Cardiovascular: Negative for chest pain and palpitations.   Endocrine: Negative for cold intolerance and heat intolerance.   Genitourinary: Positive for menstrual problem and vaginal bleeding. Negative for decreased urine volume, dyspareunia, dysuria, hematuria and vaginal pain.   Musculoskeletal: Positive  for arthralgias. Negative for joint swelling and myalgias.   Neurological: Negative for dizziness, weakness and headaches.   Psychiatric/Behavioral: Negative for decreased concentration and suicidal ideas. The patient is not nervous/anxious.          PHYSICAL EXAM:   05/31/17  0841   BP: (!) 140/92   Pulse: 76   Resp: 16   Temp: 98.3 F (36.8 C)     Body mass index is 49.39 kg/m.    Physical Exam   Constitutional: She is oriented to person,  place, and time. She appears well-developed and well-nourished.   HENT:   Head: Normocephalic and atraumatic.   Cardiovascular: Normal rate.   Pulmonary/Chest: Effort normal and breath sounds normal. No respiratory distress. She has no wheezes.   Abdominal: She exhibits no distension. There is no tenderness.   Musculoskeletal: She exhibits tenderness. She exhibits no edema or deformity.   Stiffness of right hand, weakness in squeeze with tenderness.   Neurological: She is alert and oriented to person, place, and time. A sensory deficit (right thumb) is present.   Skin: Skin is warm and dry. Capillary refill takes less than 2 seconds.   Psychiatric: She has a normal mood and affect. Her behavior is normal. Thought content normal.   Vitals reviewed.          ASSESSMENT & PLAN:    Kathleen May was seen today for other.    Diagnoses and all orders for this visit:    Postmenopausal bleeding  Assessment & Plan:  Active  Order transvaginal/pelvic u/s  Stop decrease ibuprofen use  Monitor symptoms   PAP overdue - advised to schedule appointment     Orders:  -     US Pelvic Transabd/Transvag Combination  -     HGB (POCT) BLOOD    Right wrist pain  Assessment & Plan:  Active  Overuse vs carpel tunnel  Order xray  Referral to ortho    Orders:  -     X-Ray Hand Minimum 3 Views  -     Orthopedics Clinic        ICD-10-CM ICD-9-CM    1. Postmenopausal bleeding N95.0 627.1 US Pelvic Transabd/Transvag Combination      HGB (POCT) BLOOD   2. Right wrist pain M25.531 719.43 X-Ray Hand Minimum 3 Views      Orthopedics Clinic             Future Appointments   Date Time Provider Department Center   05/31/2017  8:30 AM Danton Sewer, [place] [facility]   06/29/2017  9:45 AM Marshal Schrecengost, Junious Dresser, PA [facility]           Electronically reviewed and signed by Danton Sewer, PA-C    [facility]  [email or web address]

## 2017-06-01 ENCOUNTER — Encounter (INDEPENDENT_AMBULATORY_CARE_PROVIDER_SITE_OTHER): Payer: Self-pay

## 2017-06-04 ENCOUNTER — Telehealth (INDEPENDENT_AMBULATORY_CARE_PROVIDER_SITE_OTHER): Payer: Self-pay | Admitting: Physician Assistant

## 2017-06-04 ENCOUNTER — Other Ambulatory Visit: Payer: Self-pay

## 2017-06-04 NOTE — Telephone Encounter (Signed)
Pt notified by DVM.

## 2017-06-04 NOTE — Telephone Encounter (Signed)
Wrist xray returned:  - mild to moderate degree degenerative changes of interphalangeal joints. Mild degenerative changes of first carpometacarpal joint  - stable multiple periarticular calcifications   Pt should see ortho for consult if pain continues to worsen.

## 2017-06-08 NOTE — Addendum Note (Signed)
Addended by: Danton Sewer on: 06/08/2017 01:28 PM     Modules accepted: Orders

## 2017-06-12 ENCOUNTER — Encounter (INDEPENDENT_AMBULATORY_CARE_PROVIDER_SITE_OTHER): Payer: Self-pay | Admitting: Physician Assistant

## 2017-06-13 NOTE — Telephone Encounter (Signed)
From: Kathleen May  To: Kathleen May, [place]  Sent: 06/12/2017 8:39 PM PDT  Subject: De Nurse,  I have an appointment set up for 07/12/17 with [name] for a full pelvic & pap check....just wanted to confirm that this date is okay, no need to rush?    Thanks,  Kathleen May.

## 2017-06-14 ENCOUNTER — Encounter (INDEPENDENT_AMBULATORY_CARE_PROVIDER_SITE_OTHER): Payer: Self-pay | Admitting: Physician Assistant

## 2017-06-14 NOTE — Telephone Encounter (Signed)
From: Alysia Penna  To: Danton Sewer, [place]  Sent: 06/14/2017 11:31 AM PDT  Subject: Orpah Cobb Urgent Medical Advice    Good Morning Kathleen May,    Changed my appointment to see an OB/GYN as opposed to a NP. The earliest they could schedule is 07/23/17 with Dr. Yetta Barre (as I was adamant about only seeing a female physician). Did you still want me to keep my appointment with you on 07/13/17 or reschedule for a later date?    Kathleen May.

## 2017-06-26 ENCOUNTER — Encounter (INDEPENDENT_AMBULATORY_CARE_PROVIDER_SITE_OTHER): Payer: Self-pay | Admitting: Physician Assistant

## 2017-06-26 NOTE — Telephone Encounter (Signed)
From: Alysia Penna  To: Danton Sewer, [place]  Sent: 06/26/2017 2:17 PM PDT  Subject: 1-Non Urgent Medical Advice    Good Germain Osgood,    Last Friday I began cramping that soon escalated to severe (an 9+ on a scale 1-10) and lasted through Sunday with varying degrees of pain, but with very little bleeding. I was taking 2 Tylenol extra strength tablets every 6 hours to help with the pain. Then today (about 12:45)  I began cramping again (3+) and this was followed up with quite significant clotting (so far around 4 to 5 pancake size clots) with a concernable but not overwhelming amount of bleeding. I feel a certain amount of abdominal pressure and have noted that I am having to urinate MUCH more frequently than usual. With all this being said, when should I become concerned about any/all of this?    Thank you in advance,  Kathleen May.

## 2017-06-27 ENCOUNTER — Encounter (INDEPENDENT_AMBULATORY_CARE_PROVIDER_SITE_OTHER): Payer: Self-pay | Admitting: Physician Assistant

## 2017-06-27 NOTE — Telephone Encounter (Signed)
From: Alysia Penna  To: Danton Sewer, [place]  Sent: 06/27/2017 4:22 PM PDT  Subject: Melburn Popper Afternoon [facility] was able to move me up to next Wednesday, but are requesting my records be sent to them as soon as humanly possible. Would one of the staff please assist and fax my records to [phone] in c/o Danella Penton. Charlesetta Shanks, DO, FACOG? It would be greatly appreciated.    Thank you,  Rikki Spearing.

## 2017-06-29 ENCOUNTER — Ambulatory Visit (INDEPENDENT_AMBULATORY_CARE_PROVIDER_SITE_OTHER): Admitting: Physician Assistant

## 2017-07-07 ENCOUNTER — Other Ambulatory Visit: Payer: Self-pay

## 2017-07-11 LAB — COMPREHENSIVE METABOLIC PANEL, BLOOD
ALT (SGPT): 23 U/L (ref 6–29)
AST (SGOT): 20 U/L (ref 10–35)
Albumin/Glob Ratio: 1.4 (calc) (ref 1.0–2.5)
Albumin: 4.2 g/dL (ref 3.6–5.1)
Alkaline Phos: 50 U/L (ref 33–130)
BUN: 14 mg/dL (ref 7–25)
Bilirubin, Total: 0.7 mg/dL (ref 0.2–1.2)
Calcium: 9.8 mg/dL (ref 8.6–10.4)
Carbon Dioxide: 32 mmol/L (ref 20–32)
Chloride: 103 mmol/L (ref 98–110)
Creatinine: 0.7 mg/dL (ref 0.50–1.05)
Globulin: 2.9 g/dL (calc) (ref 1.9–3.7)
Glucose: 131 mg/dL — ABNORMAL HIGH (ref 65–99)
Potassium: 3.7 mmol/L (ref 3.5–5.3)
Sodium: 140 mmol/L (ref 135–146)
Total Protein: 7.1 g/dL (ref 6.1–8.1)
eGFR African American: 112 mL/min/{1.73_m2} (ref >=60)
eGFR non-Afr.American: 97 mL/min/{1.73_m2} (ref >=60)

## 2017-07-11 LAB — RANDOM URINE MICROALB/CREAT RATIO PANEL
Creatinine, Random Urine: 154 mg/dL (ref 20–275)
Microalbumin Random: 5.9 mg/dL
Microalbumin Ratio: 38 mcg/mg creat — ABNORMAL HIGH (ref <30)

## 2017-07-11 LAB — GLYCOSYLATED HGB(A1C), BLOOD: Hgb A1C: 6.3 % of total Hgb — ABNORMAL HIGH (ref <5.7)

## 2017-07-12 ENCOUNTER — Encounter (INDEPENDENT_AMBULATORY_CARE_PROVIDER_SITE_OTHER): Payer: Self-pay | Admitting: Physician Assistant

## 2017-07-12 ENCOUNTER — Other Ambulatory Visit (INDEPENDENT_AMBULATORY_CARE_PROVIDER_SITE_OTHER): Payer: Self-pay | Admitting: Physician Assistant

## 2017-07-12 DIAGNOSIS — I10 Essential (primary) hypertension: Principal | ICD-10-CM

## 2017-07-12 DIAGNOSIS — E119 Type 2 diabetes mellitus without complications: Secondary | ICD-10-CM

## 2017-07-12 MED ORDER — METFORMIN HCL 500 MG OR TB24
500.0000 mg | ORAL_TABLET | Freq: Every day | ORAL | 1 refills | Status: DC
Start: 2017-07-12 — End: 2018-01-07

## 2017-07-12 MED ORDER — LISINOPRIL-HYDROCHLOROTHIAZIDE 20-25 MG OR TABS
1.0000 | ORAL_TABLET | Freq: Every day | ORAL | 1 refills | Status: DC
Start: 2017-07-12 — End: 2018-01-07

## 2017-07-13 ENCOUNTER — Ambulatory Visit (INDEPENDENT_AMBULATORY_CARE_PROVIDER_SITE_OTHER): Admitting: Physician Assistant

## 2017-07-23 ENCOUNTER — Telehealth (INDEPENDENT_AMBULATORY_CARE_PROVIDER_SITE_OTHER): Payer: Self-pay | Admitting: Physician Assistant

## 2017-08-02 ENCOUNTER — Ambulatory Visit (INDEPENDENT_AMBULATORY_CARE_PROVIDER_SITE_OTHER): Admitting: Family Medicine

## 2017-08-02 ENCOUNTER — Encounter (INDEPENDENT_AMBULATORY_CARE_PROVIDER_SITE_OTHER): Payer: Self-pay

## 2017-08-02 VITALS — BP 130/70 | HR 75 | Temp 97.8°F | Resp 16 | Ht 61.0 in | Wt 261.0 lb

## 2017-08-02 DIAGNOSIS — N950 Postmenopausal bleeding: Principal | ICD-10-CM

## 2017-08-02 DIAGNOSIS — Z01810 Encounter for preprocedural cardiovascular examination: Secondary | ICD-10-CM

## 2017-08-02 DIAGNOSIS — Z01818 Encounter for other preprocedural examination: Secondary | ICD-10-CM

## 2017-08-02 MED ORDER — MEDROXYPROGESTERONE ACETATE 10 MG OR TABS
20.00 mg | ORAL_TABLET | Freq: Every day | ORAL | 0 refills | Status: DC
Start: 2017-07-30 — End: 2017-08-17

## 2017-08-03 ENCOUNTER — Encounter (INDEPENDENT_AMBULATORY_CARE_PROVIDER_SITE_OTHER): Payer: Self-pay

## 2017-08-06 ENCOUNTER — Telehealth (INDEPENDENT_AMBULATORY_CARE_PROVIDER_SITE_OTHER): Payer: Self-pay | Admitting: Physician Assistant

## 2017-08-10 ENCOUNTER — Telehealth (INDEPENDENT_AMBULATORY_CARE_PROVIDER_SITE_OTHER): Payer: Self-pay | Admitting: Physician Assistant

## 2017-08-10 DIAGNOSIS — Z5320 Procedure and treatment not carried out because of patient's decision for unspecified reasons: Principal | ICD-10-CM

## 2017-08-13 DIAGNOSIS — Z5320 Procedure and treatment not carried out because of patient's decision for unspecified reasons: Principal | ICD-10-CM

## 2017-08-14 ENCOUNTER — Telehealth (HOSPITAL_BASED_OUTPATIENT_CLINIC_OR_DEPARTMENT_OTHER): Payer: Self-pay

## 2017-08-14 NOTE — Telephone Encounter (Signed)
**  Urgent new pt**    Ref MD: [facility]  Dx: Kathleen May   Ins: [employer] PPO  Auth: none required    Records: scanned into chart

## 2017-08-14 NOTE — Telephone Encounter (Signed)
Call to patient.Stat referral from Dr.Lebo. Well Diff Endometrioid [place], FIGO Grad1, 08/07/17. Patient offered 8/6 in [place]. She prefers sooner if possible. She can go to [facility] if needed. I informed her I can send a message to MD/Team. I can check if there is availability for Friday and call her back. She is gong to her OBGYN now for a f/u. I can leave info and email details as well. She thanked me for the call.

## 2017-08-15 NOTE — Telephone Encounter (Signed)
Incoming call from patient. I informed her I was awaiting confirmation regarding schedule in [place].  Consultation scheduled with Dr. Sherryl Barters, on Friday, 08/17/17, checking in at 2:00pm, for a 2:20pm, at [facility] location. Kathleen May, check in at front desk. NP emailed.I reiterated importance of early arrival 2:00pm, with insurance card and ID, NP packet.

## 2017-08-16 ENCOUNTER — Telehealth (HOSPITAL_BASED_OUTPATIENT_CLINIC_OR_DEPARTMENT_OTHER): Payer: Self-pay | Admitting: Gynecologic Oncology

## 2017-08-16 NOTE — Telephone Encounter (Signed)
Provided Patient Navigation orientation to [facility] including directions, parking information, logistics and appointment reminder for 08/17/2017 with Dr. Coralee Pesa.     Patient Navigator introduced Patient Navigation program and the role to assist with general support and resources.     Patient stated she has not received new patient paperwork via e-mail from [facility] Transition department). Navigator connected with the above mentioned department and spoke with [facility] which stated she would send patient e-mail with new patient paperwork on this date. Navigator verified patients e-mail address on file ([email or web address]) and Navigator e-mail sent successfully with general information.     Per patient, referring Gynecologist, Dr. Mauri Brooklyn would notify Dr. Coralee Pesa regarding patient heavy bleeding and possible change in medication to stronger progesterone. Patient complained of dizziness and "bleeding so bad it runs down my leg." Navigator notified patient to connect with referring provider regarding this issue however if emergency services are needed to go to her nearest emergency room or dial 911.     Patient expressed frustration, stating "I can't seem to get anyone to do anything." Patient stated she was seen on Sunday, 08/12/2017 at [facility] and per patient, she was not provided any relief, stating "I would rather go to a veterinarian than back to them for care." Patient stated blood work was provided however no imaging or prescription was provided on that date. Patient verbally agreed to options presented: connecting with her Gynecologist, go to a local urgent care or emergency department, or call 911 in case of emergency. Navigator notified patient a detailed note regarding our conversation would be documented in her medical record for the clinical team to review as well. Patient expressed verbal understanding.     Patient states not familiar with [facility]  and Navigator offered to provide this information via electronic communication. Navigator verified e-mail address and e-mail message with parking, directions, MyChart registration information, and [place] patient guide was sent on this date. Patient verbally confirmed receipt of this Navigator's e-mail prior to disconnecting at length phone call. Navigator reiterated to patient, this Navigator is not a nurse, unable to provide clinical advice/assistance, and should not be utilized as urgent connection to clinical care team. Patient expressed verbal understanding. Patient stated she has established support system from her husband.     Patient Navigator routed message to Gynecology Oncology disease team on this date, to include Leanna Battles, LCSW and the  nurse case manager.     Patient expressed gratitude for the phone call by this Patient Navigator. Navigator will continue to remain available for general support and resources.    Thomes Lolling  Patient Navigator

## 2017-08-17 ENCOUNTER — Other Ambulatory Visit (HOSPITAL_BASED_OUTPATIENT_CLINIC_OR_DEPARTMENT_OTHER): Payer: BLUE CROSS/BLUE SHIELD

## 2017-08-17 ENCOUNTER — Encounter (HOSPITAL_BASED_OUTPATIENT_CLINIC_OR_DEPARTMENT_OTHER): Payer: Self-pay

## 2017-08-17 ENCOUNTER — Ambulatory Visit: Payer: BLUE CROSS/BLUE SHIELD | Attending: Gynecologic Oncology | Admitting: Gynecologic Oncology

## 2017-08-17 ENCOUNTER — Encounter (HOSPITAL_BASED_OUTPATIENT_CLINIC_OR_DEPARTMENT_OTHER): Payer: Self-pay | Admitting: Gynecologic Oncology

## 2017-08-17 VITALS — BP 133/75 | HR 73 | Temp 99.1°F | Resp 16 | Ht 62.0 in | Wt 258.3 lb

## 2017-08-17 DIAGNOSIS — I10 Essential (primary) hypertension: Secondary | ICD-10-CM | POA: Insufficient documentation

## 2017-08-17 DIAGNOSIS — E119 Type 2 diabetes mellitus without complications: Secondary | ICD-10-CM | POA: Insufficient documentation

## 2017-08-17 DIAGNOSIS — C541 Malignant neoplasm of endometrium: Secondary | ICD-10-CM | POA: Insufficient documentation

## 2017-08-17 DIAGNOSIS — Z6841 Body Mass Index (BMI) 40.0 and over, adult: Secondary | ICD-10-CM | POA: Insufficient documentation

## 2017-08-17 LAB — COMPREHENSIVE METABOLIC PANEL, BLOOD
ALT (SGPT): 17 U/L (ref 0–33)
AST (SGOT): 18 U/L (ref 0–32)
Albumin: 4.3 g/dL (ref 3.5–5.2)
Alkaline Phos: 50 U/L (ref 35–140)
Anion Gap: 14 mmol/L (ref 7–15)
BUN: 15 mg/dL (ref 6–20)
Bicarbonate: 26 mmol/L (ref 22–29)
Bilirubin, Tot: 0.31 mg/dL (ref <1.2)
Calcium: 9.7 mg/dL (ref 8.5–10.6)
Chloride: 106 mmol/L (ref 98–107)
Creatinine: 0.8 mg/dL (ref 0.51–0.95)
GFR: >60 mL/min
Glucose: 110 mg/dL — ABNORMAL HIGH (ref 70–99)
Potassium: 3.8 mmol/L (ref 3.5–5.1)
Sodium: 146 mmol/L — ABNORMAL HIGH (ref 136–145)
Total Protein: 7.6 g/dL (ref 6.0–8.0)

## 2017-08-17 LAB — CBC WITH DIFF, BLOOD
ANC-Automated: 8.8 10*3/uL — ABNORMAL HIGH (ref 1.6–7.0)
ANC-Instrument: 8.8 10*3/uL — ABNORMAL HIGH (ref 1.6–7.0)
Abs Basophils: 0 10*3/uL (ref <0.1)
Abs Eosinophils: 0.2 10*3/uL (ref 0.1–0.5)
Abs Lymphs: 2.8 10*3/uL (ref 0.8–3.1)
Abs Monos: 0.8 10*3/uL (ref 0.2–0.8)
Basophils: 0 %
Eosinophils: 1 %
Hct: 38.2 % (ref 34.0–45.0)
Hgb: 11.9 gm/dL (ref 11.2–15.7)
Imm Gran Abs: 0.1 10*3/uL (ref <0.1)
Lymphocytes: 22 %
MCH: 27.4 pg (ref 26.0–32.0)
MCHC: 31.2 g/dL — ABNORMAL LOW (ref 32.0–36.0)
MCV: 88 um3 (ref 79.0–95.0)
MPV: 11.4 fL (ref 9.4–12.4)
Monocytes: 6 %
Plt Count: 274 10*3/uL (ref 140–370)
RBC: 4.34 10*6/uL (ref 3.90–5.20)
RDW: 12.6 % (ref 12.0–14.0)
Segs: 70 %
WBC: 12.7 10*3/uL — ABNORMAL HIGH (ref 4.0–10.0)

## 2017-08-17 LAB — TYPE & SCREEN
ABO/RH: A POS
Antibody Screen: NEGATIVE

## 2017-08-17 LAB — APTT, BLOOD: PTT: 31 s (ref 25–34)

## 2017-08-17 LAB — PROTHROMBIN TIME, BLOOD
INR: 1.1
PT,Patient: 12.9 s — ABNORMAL HIGH (ref 9.7–12.5)

## 2017-08-17 LAB — ABO/RH CONFIRMATION: ABO/RH: A POS

## 2017-08-17 MED ORDER — MULTIVITAMIN ADULT PO: 1.00 | Freq: Every day | ORAL | Status: AC

## 2017-08-17 MED ORDER — NATURAL SUPPLEMENT: 1.00 | Freq: Every day | Status: AC

## 2017-08-17 MED ORDER — MEGESTROL ACETATE 40 MG OR TABS
80.0000 mg | ORAL_TABLET | Freq: Two times a day (BID) | ORAL | 2 refills | Status: DC
Start: 2017-08-17 — End: 2017-10-09

## 2017-08-17 MED ORDER — VITAMIN D3 1000 UNIT OR TABS: 1000.00 [IU] | ORAL_TABLET | Freq: Every day | ORAL | Status: AC

## 2017-08-17 MED ORDER — SALMON OIL-1000 OR CAPS: 1.00 | ORAL_CAPSULE | Freq: Every day | ORAL | Status: AC

## 2017-08-17 NOTE — Progress Notes (Signed)
Social Work Psychosocial Assessment    LCSW covering for primary [place].    Diagnosis:  Endometrial cancer, pt of Dr. Coralee Pesa   Reason for Referral: New patient    Family Constellation: Husband, 2 children one in [place] one local    Patient lives with:  Husband, in [place] of Income:  Not assessed, pt not currently working    Interview Data/Assessment: Pt was very tired only able to do a brief assessment, "Im just overwhelmed with everything", "Its a long drive for [place]". Pt pleasant, cooperative in answering questions. Per staff pt son had lymphoma a couple of years ago. Pt did not discuss with LCSW.     Transportation: pt will get bulk of treatment in [place], husband able to transport    Advanced care planning: husband would be decision maker, no advanced directive in place    Intervention:  Introduction to role of Education officer, museum, contacting social work. Pt appreciated the information and stated she will call if questions or need support    Referrals    Resources provided for support groups, emotional support    Plan: Social work to remain available PRN    [employer], LCSW

## 2017-08-17 NOTE — Progress Notes (Signed)
Gynecologic Oncology New Patient Visit    Referring MD: Darel Hong  Attending MD: Coralee Pesa  Referral indication: newly diagnosed grade 1 endometrioid adenocarcinoma    ID: Kathleen May is a 57 year old with postmenopausal vaginal bleeding and newly diagnosed grade 1 endometrioid adenocarcinoma    HPI:  Kathleen May reports that she started postmenopausal bleeding in 02/2017. She reports her bleeding was light and required only a pantyliner.However, around mother's day, she began to have heavy bleeding mild cramping. She has had clots and heavy bleeding almost daily ever since.  She underwent a pelvic US that demonstrated and thickened EEC. She underwent a hysteroscopy with polyp removal/D&C on 08/07/17 that demonstrated grade 1 endometrioid adenocarcinoma. She reports that she is still bleeding and passing clots. She reports that she was told she was anemic but she has not received a blood transfusion. She reports that she was previously on Provera though now has just received authorization for Megace.       PMH:  Arthritis  Chronic HTN  Type 2 Diabetes mellitus: reports that her A1c is down to "preDM range," though it has been worsening since she is no longer exercising    PSH:  Past Surgical History:   Procedure Laterality Date   . CESAREAN SECTION, CLASSIC  1982    2nd 1989   . HYSTEROSCOPY W/ POLYPECTOMY     . INCISIONAL BREAST BIOPSY Left          Meds:  Current Outpatient Medications on File Prior to Visit   Medication Sig Dispense Refill   . Cholecalciferol (VITAMIN D3) 1000 units tablet Take 1,000 Units by mouth daily.     Marland Kitchen lisinopril-hydrochlorothiazide (ZESTORETIC) 20-25 MG tablet Take 1 tablet by mouth daily. 90 tablet 1   . medroxyPROGESTERone (PROVERA) 10 MG tablet Take 20 mg by mouth daily.  0   . metFORMIN (GLUCOPHAGE XR) 500 MG XR tablet Take 1 tablet (500 mg) by mouth daily. 90 tablet 1   . Multiple Vitamins-Minerals (MULTIVITAMIN ADULT PO) Take 1 tablet by mouth daily.     . natural supplement Take  1 tablet by mouth daily.     . Omega-3 Fatty Acids (SALMON OIL-1000) 200 MG CAPS Take 1 tablet by mouth daily.       No current facility-administered medications on file prior to visit.          Allergy:  Codeine: difficulty breathing (throat swelling) > however Norco is okay though she reports that it does not help with her pain  Latex gloves (Rash)    SH:  H/o tobacco use 1 pack/day x 20 years, last use 2008  Social alcohol use  Occasionally MJ daily  Lives with husband    OBH:  [id number]  C/S x2  SAB x1 (1988) requiring D&C  TAB x1 (1983)    Family History:  Father - unknown type of cancer (not in touch), but deceased  Maternal Grandmother - breast cancer, dx age 49s  Maternal Aunt- cervical cancer, dx age 21  Sister - asthma  Son - Hodgkin's lymphoma    GynH:  Menarche 14  Irregular requiring COCs though reports her menses was light lasting 24 hours  Menopause 53  HRT: denies  COC: reports that she was on COCs from age [age]  PMVB see HPI  [place]: 07/26/17 wnl per patient  AMMG: denies  LPS: 07/09/2017 wnl per patietn  APS: denies  BCM: husband with vasectomy       08/17/17  1418  BP: 133/75   Pulse: 73   Resp: 16   Temp: 99.1 F (37.3 C)   SpO2: 98%     General: appears comfortable, no acute distress  HEENT: normocephalic, atraumatic, no thyromegaly, no thyroid nodules, no supraclavicular lymphadenpathy  Cardiovascular: regular rate and rhythm, no murmurs appreciated  Pulmonary: nonlabored breathing on room air, clear to auscultation bilaterally   Back: no costovertebral tenderness bilaterally  Abdomen: soft, nontender, nondistended, bowel sounds present, well healed transverse skin incision  Pelvic: see Binder's note  Extremities: no cyanosis, mild bilateral lower extremity swelling, symmetrical    Labs:  Pathology results uploaded to media:  - grade 1 endometrioid adenocarcinoma  - MMR intact    Pap: NIL    Imaging:  Pending    Assessment/Plan:  Kathleen May is a 57 year old with newly diagnosed G1 EMCA  causing daily PMVB. Reviewed her pathology with the patient and her husband. Plan for definitive surgery. Plan to start Megace while pending surgery.     - continue Megace while pending OR clearance  - obtain pelvic US from OSH  - plan ultimately to OR for likely RA-TLH/BSO/SLND  - preop labs  - f/u PCP and cardiology for clearance   - bleeding precautions    Patient seen and examined by attending Binder.    Elroy Channel, MD  Provider ID: [id number]  Gynecologic Oncology Fellow, PGY-5  Service Pager: [phone]  Personal Pager: [phone]  08/17/17  3:29 PM

## 2017-08-17 NOTE — Interdisciplinary (Signed)
Not assessed by RN

## 2017-08-17 NOTE — Patient Instructions (Addendum)
Plan:  * Labs have been ordered for you. Please complete those today.    * See PCP Dr. Juline Patch [address], [place], [place] [address]  and cardiologist Dr. Minna Merritts Dr     [facility]  Cardiology  [phone], [address], [place] [address]  [address]: Near the intersection of [address]    [phone]    *Please follow up in our office once you have received clearance. We will discuss next steps.    [place] [address] Phone List for Patients:    Clinic Hours of Operation: Monday-Friday 8:00- 5:00p.m. (closed holidays and weekends)  Infusion Center Hours: Monday-Friday 7:00-7:30 p.m.; Sat-Sun 8:00-4:30pm.  [address] (In-patient Unit, [facility]) Hours: Open 24 hours     AFTER HOURS EMERGENCY NUMBER    [phone]     **Ask for the GynecologyOncology Physician On-Call.    Physician: Sherryl Barters, MD    Nurse Case Manager: Markus Jarvis RN, OCN   Phone#  [phone]     Fax# [phone]    Administrative Assistant: Janene Harvey     Phone#    [phone]     Fax# [phone]  **Please call prior to coming in to pick up forms or letters to insure their availability; as they take 24-48 hours to prepare.     Social Worker: Leanna Battles, LCSW  [phone]  For emotional, social, spiritual and practical needs that may arise throughout cancer treatment and recovery.    Imaging Scheduling: [phone]    PET Scheduling: [phone]    Information Desk: [phone]  ** General information: directions, telephone numbers, or available services.    [facility]: [phone]  **Call to schedule, cancel, or reschedule clinic appointments.    Infusion Center: [phone] [address] 2  ** Call to schedule, cancel, or reschedule chemotherapy appointments.    Interventional Radiology: [phone]    Procedure Suite: [phone]    Radiation Oncology: [phone]    Clinical Trials Office: [phone]    [place] [facility]: [phone]    Medical Records: [phone]    Financial Counselor: Esperanza Richters  Phone: [phone]  Fax [phone]

## 2017-08-17 NOTE — Progress Notes (Addendum)
Demographics:  Date: August 17, 2017   Patient Name: Kathleen May   DOB: [birth year]  Age: 57 year old  Sex: female    Requesting Physician:      Anselm Pancoast    Primary Care Physician:   Thayer Dallas    Reason for visit: Endometrial Cancer    Clinic Location:      [facility] [place] [address]    History of Present Illness:  Kathleen May, is a 57 year old referred for the evaluation of Endometrial cancer.    She initially presented with symptoms of postmenopausal bleeding in 02/2017. Her initial work-up included a pelvic ultrasound and referral to Dr. Darel Hong who performed hysteroscopy and D&C that showed grade 1 endometrioid adenocarcinoma.    Today she presents with her husband. She complains of continued vaginal bleeding. She was seen in the ED at [facility] center on Sunday, labs were normal so she was discharged. She was given 10 days of Provera in May.    Cancer Hx:  06/08/17: Pelvic U/S showed uterus is normal in size, shape and echogenicity (normal anteroposterior (AP) diameter is around 3-7 cm with a normal uterine length of about 5-12 cm. Endometrium: The endometrial stripe is measuring 16 mm thick. Ovaries: Both ovaries are not visualized due to overlying bowel gas. There is a small amount of fluid in the cervix. Nabothian cysts are noted. Images reviewed. Uterus is 10x6x4cm.  08/07/17: Endometrial curettings: Grade 1 endometrioid adenocarcinoma, MMR intact      Past Medical History:   Diagnosis Date   . Arthritis    . Diabetes (CMS-HCC)    . Hypertension      Past Surgical History:   Procedure Laterality Date   . CESAREAN SECTION, CLASSIC  1982    2nd 1989   . HYSTEROSCOPY W/ POLYPECTOMY     . INCISIONAL BREAST BIOPSY Left        OB History   Gravida Para Term Preterm AB Living   4 2 0 0 2 0   SAB TAB Ectopic Multiple Live Births   0 0 0 0 0   C/S x2  SAB x1 (1988) requiring D&C  TAB x1 (1983)    Gyn History     Menarche 14  Irregular requiring COCs though reports her menses was light lasting 24 hours  Menopause 53  HRT: denies  COC: reports that she was on COCs from age [age]  PMVB see HPI  LMMG: 07/26/17 wnl per patient  AMMG: denies  LPS: 07/09/2017 wnl per patient  APS: denies  BCM: husband with vasectomy    CANCER SCREENING  Last pap: 07/09/17, NILM, HPV neg  Abnormal paps: None  Mammogram: 07/26/17 wnl per patient  Colonoscopy: Never    Current Outpatient Medications   Medication Sig   . Cholecalciferol (VITAMIN D3) 1000 units tablet Take 1,000 Units by mouth daily.   Marland Kitchen lisinopril-hydrochlorothiazide (ZESTORETIC) 20-25 MG tablet Take 1 tablet by mouth daily.   . megestrol (MEGACE) 40 MG tablet Take 2 tablets (80 mg) by mouth 2 times daily.   . metFORMIN (GLUCOPHAGE XR) 500 MG XR tablet Take 1 tablet (500 mg) by mouth daily.   . Multiple Vitamins-Minerals (MULTIVITAMIN ADULT PO) Take 1 tablet by mouth daily.   . natural supplement Take 1 tablet by mouth daily.   . Omega-3 Fatty Acids (SALMON OIL-1000) 200 MG CAPS Take 1 tablet by  mouth daily.     No current facility-administered medications for this visit.        Allergies   Allergen Reactions   . Latex Rash   . Codeine Other       Social History     Socioeconomic History   . Marital status: Married     Spouse name: Not on file   . Number of children: Not on file   . Years of education: Not on file   . Highest education level: Not on file   Occupational History   . Not on file   Tobacco Use   . Smoking status: Former Smoker     Packs/day: 2.00     Last attempt to quit: 2008     Years since quitting: 11.5   . Smokeless tobacco: Never Used   Substance and Sexual Activity   . Alcohol use: Yes     Frequency: Monthly or less     Drinks per session: 1 or 2     Binge frequency: Less than monthly   . Drug use: Never   . Sexual activity: Not on file   Social Activities of Daily Living Present   . Not on file   Social History Narrative   . Not on file       Family History    Problem Relation Name Age of Onset   . Hypertension Mother     . Diabetes Mother     . Other Mother     . Liver Disease Father     . Cancer Father     . Cancer Maternal Grandmother          breast   . Cancer Maternal Aunt          Ovarian     No family history of uterine or colon cancer.      Review of Systems -   Constitutional: negative, no fatigue.   Neuro: negative, no HAs.  ENT: negative, no change in hearing.  Eyes: negative, no change in vision.  CV: negative, no chest pain.   Resp: negative, no SOB.   GI: negative, no abdominal bloating/distension, no N/V or anorexia, no constipation/diarrhea, no BRBPR.   GU: negative.   Musculoskeletal: negative.  Endo: negative.  Heme/Lymphatic: negative.   GYN: negative.  Psych: negative.    Physical Examination:  Vitals: BP 133/75 (BP Location: Left arm, BP Patient Position: Sitting, BP cuff size: Large)   Pulse 73   Temp 99.1 F (37.3 C) (Oral)   Resp 16   Ht '5\' 2"'$  (1.575 m)   Wt 117.2 kg (258 lb 4.8 oz)   SpO2 98%   BMI 47.24 kg/m   Neuro: Alert and oriented.  General: No acute distress, pleasant female.   Eyes: EOMI, non-icteric sclera.  Neck: no thyromegaly, no lymphadenopathy.  Cardiovascular: RRR, no murmurs. No peripheral edema.  Pulm: Clear to ausculation bilaterally, no crackles, good air movement.  Back/Musculoskeletcal: No costovertebral angle tenderness.   Abd: Soft, non-tender to palpation, non-distended, no rebound or guarding, obese abdomen.  Skin: Warm and well perfused. No rashes.  Genito-urinary: Normal external female genitalia, normal urethra.  Speculum exam: normal vaginal mucosa, watery blood in vault, normal cervix.  Bimanual:  8cm mobile uterus, no adnexal masses palpated but exam severely limited due to body habitus and patient discomfort.          ASSESSMENT/PLAN:  Kathleen May, is a 57 year old referred for the evaluation of endometrial cancer  1) Endometrial cancer  - I explained to the patient the nature and biology of endometrial  cancer, we reviewed risk factors for endometrial cancer such as obesity, nulliparity and estrogen exposure. We discussed the grading system of endometrial cancer and the importance of surgical staging in regards to prognosis and treatment options. Discussed in detail recommendation for definitive surgical management of endometrial cancer to include hysterectomy, bilateral salpingoophorectomy, sentinel lymph node dissection, possible pelvic and para aortic lymph node dissection, and possible omentectomy. Other management options including progesterone and radiation were discussed with these are not recommended primarily.  - I outlined recommendation for minimally invasive approach utilizing the robotic surgical sytem, although we also discussed traditional laparoscopy as well as laparotomy. Benefits of minimally invasive approach were discussed in detail, as well as those of robotic surgery. Risks of surgery include but are not limited to pain, bleeding, infection, lymphedema and damage to nearby organs including bowel, bladder, ureters and blood vessels that would require further surgery.   - Also discussed that pending pathology, we may recommend adjuvant therapy post-operatively to include radiation or chemotherapy or a combination of the two.  - Patient voiced understanding. All questions were answered in detail. Follow-up for pre-op assessment after you get PCP and cardiology clearance.    2) Type II DM, HTN, Obesity with BMI 47.2 and decreased functional capacity (takes a long time to walk 1 block)  - Needs PCP clearance for robotic hysterectomy  - Needs cardiology work-up and clearance for robotic hysterectomy.     3) Heavy vaginal bleeding  - Labs ordered today to see if pt would need transfusion prior to surgery.   - Patient started on Megace '80mg'$  BID.    RTC in 2-3 weeks after obtaining appropriate clearances.         ICD-10-CM ICD-9-CM   1. Endometrial cancer (CMS-HCC) C54.1 182.0       Orders Placed This  Encounter   Procedures   . CBC w/ Diff Lavender   . Comprehensive Metabolic Panel Green   . aPTT, Blood Blue   . Prothrombin Time, Blood Blue   . Type & Screen Lavender   . Glycosylated Hgb(A1C), Blood Lavender     CC requesting provider: Secundino Ginger

## 2017-08-17 NOTE — Interdisciplinary (Signed)
GOALS OF CARE / ADVANCE CARE PLANNING CONVERSATION NOTE    Advance Care Planning       What gives the patient's life meaning?  Family    Patient would be willing to endure aggressive medical therapies as long as they could still:   Yes    Who would make medical decisions for the patient if they are unable to make decisions for themselves?   My Husband BOb    Based on above information I recommended the following:  SW  Spoke with patient and provided recommendations    Total time spent face-to-face with patient and/or surrogate decision maker providing counseling related to advance care planning:   5 minutes

## 2017-08-19 DIAGNOSIS — C541 Malignant neoplasm of endometrium: Secondary | ICD-10-CM | POA: Insufficient documentation

## 2017-08-20 ENCOUNTER — Telehealth (INDEPENDENT_AMBULATORY_CARE_PROVIDER_SITE_OTHER): Payer: Self-pay | Admitting: Family Medicine

## 2017-08-20 ENCOUNTER — Telehealth (HOSPITAL_BASED_OUTPATIENT_CLINIC_OR_DEPARTMENT_OTHER): Payer: Self-pay

## 2017-08-20 NOTE — Telephone Encounter (Signed)
Received a VM on Kathleen May's phone from the patient requesting a call back. Returned the patient's call. She states she was never told why she needs a cardiology clearance for surgery last time she saw Dr. Coralee Pesa. Reviewed the clinic notes from last visit and explained to the patient that per notes, Dr. Coralee Pesa would like surgical clearance from both the patient's PCP and cardiologist due to patient's h/o Type II DM, HTN, Obesity with BMI 47.2 and decreased functional capacity (takes a long time to walk 1 block). The patient states she got cardiology clearance in July for a D+C and endometrial polyp removal. She states she doesn't understand why she needs another clearance. Advised the patient that the surgery (robotic hysterectomy) that Dr. Coralee Pesa will be performing carries higher risks than a D+C and endometrial polyp removal, and that Dr. Coralee Pesa wants to make sure that the patient's body can handle the surgery. The patient expressed frustration about needing a cardiology clearance. Allowed the patient to vent and provided emotional support. Advised the patient that I will let Dr. Coralee Pesa know.

## 2017-08-22 ENCOUNTER — Telehealth (HOSPITAL_BASED_OUTPATIENT_CLINIC_OR_DEPARTMENT_OTHER): Payer: Self-pay

## 2017-08-22 NOTE — Telephone Encounter (Signed)
Please call patient as she needs to scheduled her fup in [place] office with   Dr. Coralee Pesa   she can be reached at  [phone]

## 2017-08-29 ENCOUNTER — Encounter (HOSPITAL_BASED_OUTPATIENT_CLINIC_OR_DEPARTMENT_OTHER): Payer: Self-pay

## 2017-09-04 ENCOUNTER — Telehealth (HOSPITAL_BASED_OUTPATIENT_CLINIC_OR_DEPARTMENT_OTHER): Payer: Self-pay

## 2017-09-04 NOTE — Telephone Encounter (Signed)
Called and spoke with patient. Discussed that Melatonin can cause insomnia. We discussed the necessity of taking the medication. Patient previously had problems with insomnia prior to taking this medication.    Encouraged patient to take Melatonin OTC. Discussed healthy sleep habits such as no TV or electronics 1 hr before bed, limit ETOH/Caffeine, exercise, and healthy diet.    No further questions. Appreciative of the call.

## 2017-09-04 NOTE — Telephone Encounter (Signed)
Patient asking to discuss current medication she is taking ( Megace 40 mg)   She is having insomnia with it.  Can she take Melanotin 10 mg ?   She can be reached at [phone]

## 2017-09-06 ENCOUNTER — Telehealth (INDEPENDENT_AMBULATORY_CARE_PROVIDER_SITE_OTHER): Payer: Self-pay | Admitting: Family Medicine

## 2017-09-06 ENCOUNTER — Encounter (INDEPENDENT_AMBULATORY_CARE_PROVIDER_SITE_OTHER): Payer: Self-pay

## 2017-09-06 ENCOUNTER — Telehealth (HOSPITAL_BASED_OUTPATIENT_CLINIC_OR_DEPARTMENT_OTHER): Payer: Self-pay

## 2017-09-06 ENCOUNTER — Encounter (INDEPENDENT_AMBULATORY_CARE_PROVIDER_SITE_OTHER): Payer: Self-pay | Admitting: Gynecologic Oncology

## 2017-09-06 ENCOUNTER — Telehealth (HOSPITAL_BASED_OUTPATIENT_CLINIC_OR_DEPARTMENT_OTHER): Payer: Self-pay | Admitting: Gynecologic Oncology

## 2017-09-06 NOTE — Telephone Encounter (Signed)
Call received from patient, states her PCP wants to know what kind of medical clearance we need from him since her cardiologist already cleared her for surgery.  Advised letter stating that he has examined and from his medical stand point, she is cleared to proceed with planned surgery with Dr. Coralee Pesa.  Kathleen May asked if there was a form or anything that he could complete, advised I will notify our [place], she can fax a form to be completed, Dr. Cheron Schaumann fax [phone].  Appreciation expressed.  Advised we did receive the cardiac clearance today, so only PCP needed.     Call to [facility], she will fax document as requested.     Routed to Dr. Coralee Pesa to advise.

## 2017-09-06 NOTE — Telephone Encounter (Signed)
Letter faxed to Dr. Cheron Schaumann office [phone]

## 2017-09-06 NOTE — Telephone Encounter (Signed)
-----   Message from [place], RN sent at 09/06/2017  4:12 PM PDT -----  Per patient, Dr. Cheron Schaumann office needs something faxed to indicate what he needs to do to "clear her for sx".  Fax [phone].    FYI to both of you, her cardiac clearance is scanned in today.      Thanks!    Maudie Mercury

## 2017-09-07 ENCOUNTER — Encounter (INDEPENDENT_AMBULATORY_CARE_PROVIDER_SITE_OTHER): Payer: Self-pay

## 2017-09-07 NOTE — Telephone Encounter (Signed)
Yes Auth has been submitted, I will follow up with Lurline Idol to check status.

## 2017-09-10 ENCOUNTER — Ambulatory Visit (INDEPENDENT_AMBULATORY_CARE_PROVIDER_SITE_OTHER): Admitting: Family Medicine

## 2017-09-10 ENCOUNTER — Encounter (INDEPENDENT_AMBULATORY_CARE_PROVIDER_SITE_OTHER): Payer: Self-pay | Admitting: Family Medicine

## 2017-09-10 ENCOUNTER — Telehealth (INDEPENDENT_AMBULATORY_CARE_PROVIDER_SITE_OTHER): Payer: Self-pay | Admitting: Family Medicine

## 2017-09-10 ENCOUNTER — Encounter (INDEPENDENT_AMBULATORY_CARE_PROVIDER_SITE_OTHER): Payer: Self-pay

## 2017-09-10 VITALS — BP 122/68 | HR 80 | Temp 97.2°F | Resp 16 | Ht 62.0 in | Wt 254.8 lb

## 2017-09-11 ENCOUNTER — Encounter (INDEPENDENT_AMBULATORY_CARE_PROVIDER_SITE_OTHER): Payer: Self-pay

## 2017-09-11 ENCOUNTER — Telehealth (HOSPITAL_BASED_OUTPATIENT_CLINIC_OR_DEPARTMENT_OTHER): Payer: Self-pay | Admitting: Gynecologic Oncology

## 2017-09-11 NOTE — Telephone Encounter (Signed)
Call returned all taken care off.

## 2017-09-11 NOTE — Telephone Encounter (Signed)
Call from [place] at [facility] to speak with Lavella Lemons MA regarding orders.    Liz's phone: [phone]

## 2017-09-14 ENCOUNTER — Telehealth (HOSPITAL_BASED_OUTPATIENT_CLINIC_OR_DEPARTMENT_OTHER): Payer: Self-pay | Admitting: Gynecologic Oncology

## 2017-09-14 NOTE — Telephone Encounter (Signed)
Pt is calling and needs clarification on her appts. States she is seeing multiple appts on MY Chart. Please advise. Thank you.

## 2017-09-14 NOTE — Telephone Encounter (Signed)
Called and left a message informing patient, her auth for surgery at [facility] expected for 9/23/ is pending. I let her know that I will contact her as soon as I have approval and surgery has been confirmed with [place].

## 2017-09-24 ENCOUNTER — Ambulatory Visit (INDEPENDENT_AMBULATORY_CARE_PROVIDER_SITE_OTHER): Admitting: Family Medicine

## 2017-09-24 ENCOUNTER — Encounter (INDEPENDENT_AMBULATORY_CARE_PROVIDER_SITE_OTHER): Payer: Self-pay

## 2017-09-25 ENCOUNTER — Ambulatory Visit (INDEPENDENT_AMBULATORY_CARE_PROVIDER_SITE_OTHER): Payer: BLUE CROSS/BLUE SHIELD | Admitting: Gynecologic Oncology

## 2017-09-25 ENCOUNTER — Encounter (INDEPENDENT_AMBULATORY_CARE_PROVIDER_SITE_OTHER): Payer: Self-pay

## 2017-09-25 ENCOUNTER — Encounter (INDEPENDENT_AMBULATORY_CARE_PROVIDER_SITE_OTHER): Payer: Self-pay | Admitting: Family Medicine

## 2017-09-25 VITALS — BP 135/80 | HR 75 | Temp 97.8°F | Resp 18 | Ht 62.0 in | Wt 259.6 lb

## 2017-09-25 DIAGNOSIS — I10 Essential (primary) hypertension: Secondary | ICD-10-CM

## 2017-09-25 DIAGNOSIS — E119 Type 2 diabetes mellitus without complications: Secondary | ICD-10-CM

## 2017-09-25 DIAGNOSIS — C541 Malignant neoplasm of endometrium: Secondary | ICD-10-CM

## 2017-09-25 MED ORDER — BENEFIBER OR CHEW
CHEWABLE_TABLET | ORAL | Status: DC
Start: ? — End: 2018-01-15

## 2017-09-25 MED ORDER — APPLE CIDER VINEGAR PO: 450.00 mg | ORAL | Status: AC

## 2017-09-25 MED ORDER — TURMERIC 500 MG PO TABS: ORAL_TABLET | ORAL | Status: AC

## 2017-09-25 MED ORDER — GARLIC 1000 MG OR CAPS: ORAL_CAPSULE | ORAL | Status: AC

## 2017-09-25 NOTE — Progress Notes (Signed)
Demographics:  Date: September 25, 2017   Patient Name: Kathleen May   DOB: [birth year]  Age: 57 year old  Sex: female    Requesting Physician:      Anselm Pancoast    Primary Care Physician:   Margaree Mackintosh    Reason for visit: Endometrial Cancer    Clinic Location:      [facility] [place] [address]    History of Present Illness:  Kathleen May, is a 57 year old here for f/u of Endometrial cancer.    Today she presents with her husband who is waiting in the waiting room. Since her last visit, she saw Cardiology and got cardiology clearance and got PCP clearance from Dr. Margaree Mackintosh. Her EKG was normal. She said her bleeding and cramping resolved with the Megace and she is so happy she switched from the Provera. SHe did have some arm and facial itching but it is tolerable.     Cancer Hx:  06/08/17: Pelvic U/S showed uterus is normal in size, shape and echogenicity (normal anteroposterior (AP) diameter is around 3-7 cm with a normal uterine length of about 5-12 cm. Endometrium: The endometrial stripe is measuring 16 mm thick. Ovaries: Both ovaries are not visualized due to overlying bowel gas. There is a small amount of fluid in the cervix. Nabothian cysts are noted. Images reviewed. Uterus is 10x6x4cm.  08/07/17: Endometrial curettings: Grade 1 endometrioid adenocarcinoma, MMR intact.    PMHx/PSHx/PObGynHx/FHx/SHx: reviewed and unchanged from 08/17/17 other than medical clearance obtained and what is noted in HPI. She had CXR already done in 07/2017 for her D&C.      Past Medical History:   Diagnosis Date   . Abnormal uterine bleeding (AUB) 2019    Started Feb 2019   . Arthritis    . Diabetes (CMS-HCC)    . 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 Hypertension      Past Surgical History:   Procedure Laterality Date   . CESAREAN SECTION, CLASSIC  1982    2nd 1989   . HYSTEROSCOPY W/ POLYPECTOMY     . INCISIONAL BREAST  BIOPSY Left        OB History   Gravida Para Term Preterm AB Living   4 2 0 0 2 0   SAB TAB Ectopic Multiple Live Births   0 0 0 0 0   C/S x2  SAB x1 (1988) requiring D&C  TAB x1 (1983)    Gyn History    Menarche 14  Irregular requiring COCs though reports her menses was light lasting 24 hours  Menopause 53  HRT: denies  COC: reports that she was on COCs from age [age]  PMVB see HPI  LMMG: 07/26/17 wnl per patient  AMMG: denies  LPS: 07/09/2017 wnl per patient  APS: denies  BCM: husband with vasectomy    CANCER SCREENING  Last pap: 07/09/17, NILM, HPV neg  Abnormal paps: None  Mammogram: 07/26/17 wnl per patient  Colonoscopy: Never    Current Outpatient Medications   Medication Sig   . Cholecalciferol (VITAMIN D3) 1000 units tablet Take 1,000 Units by mouth daily.   Marland Kitchen lisinopril-hydrochlorothiazide (ZESTORETIC) 20-25 MG tablet Take 1 tablet by mouth daily.   . megestrol (MEGACE) 40 MG tablet Take 2 tablets (80 mg) by mouth 2 times daily.   . metFORMIN (GLUCOPHAGE XR) 500 MG XR tablet Take 1 tablet (500  mg) by mouth daily.   . Multiple Vitamins-Minerals (MULTIVITAMIN ADULT PO) Take 1 tablet by mouth daily.   . natural supplement Take 1 tablet by mouth daily.   . Omega-3 Fatty Acids (SALMON OIL-1000) 200 MG CAPS Take 1 tablet by mouth daily.     No current facility-administered medications for this visit.        Allergies   Allergen Reactions   . Latex Rash   . Codeine Other       Social History     Socioeconomic History   . Marital status: Married     Spouse name: Not on file   . Number of children: Not on file   . Years of education: Not on file   . Highest education level: Not on file   Occupational History   . Not on file   Tobacco Use   . Smoking status: Former Smoker     Packs/day: 2.00     Last attempt to quit: 2008     Years since quitting: 11.6   . Smokeless tobacco: Never Used   Substance and Sexual Activity   . Alcohol use: Yes     Frequency: Monthly or less     Drinks per session: 1 or 2     Binge frequency:  Less than monthly   . Drug use: Never   . Sexual activity: Not on file   Social Activities of Daily Living Present   . Not on file   Social History Narrative   . Not on file       Family History   Problem Relation Name Age of Onset   . Hypertension Mother     . Diabetes Mother     . Other Mother     . Liver Disease Father     . Cancer Father     . Breast Cancer Maternal Grandmother     . Ovarian Cancer Maternal Aunt       No family history of uterine or colon cancer.      Review of Systems -   Constitutional: negative, no fatigue.   Neuro: negative, no HAs.  ENT: negative, no change in hearing.  Eyes: negative, no change in vision.  CV: negative, no chest pain.   Resp: negative, no SOB.   GI: negative, no abdominal bloating/distension, no N/V or anorexia, no constipation/diarrhea, no BRBPR.   GU: negative.   Musculoskeletal: negative.  Endo: negative.  Heme/Lymphatic: negative.   GYN: negative.  Psych: negative.    Physical Examination:  Vitals: BP 135/80 (BP Location: Left arm, BP Patient Position: Sitting, BP cuff size: Regular)   Pulse 75   Temp 97.8 F (36.6 C) (Oral)   Resp 18   Ht '5\' 2"'$  (1.575 m)   Wt 117.8 kg (259 lb 9.6 oz)   SpO2 97%   BMI 47.48 kg/m   Neuro: Alert and oriented.  General: No acute distress, pleasant female.   Eyes: EOMI, non-icteric sclera.  Neck: no thyromegaly, no lymphadenopathy.  Cardiovascular: RRR, no murmurs. No peripheral edema.  Pulm: Clear to ausculation bilaterally, no crackles, good air movement.  Back/Musculoskeletcal: No costovertebral angle tenderness.   Abd: Soft, non-tender to palpation, non-distended, no rebound or guarding, obese abdomen.  Skin: Warm and well perfused. No rashes.  Genito-urinary: Normal external female genitalia, normal urethra.  Speculum exam: normal vaginal mucosa, watery blood in vault, normal cervix.  Bimanual:  8cm mobile uterus, no adnexal masses palpated but exam severely limited due to  body habitus and patient  discomfort.      ASSESSMENT/PLAN:  Kathleen May, is a 57 year old referred for the evaluation of endometrial cancer    1) Endometrial cancer  - I explained to the patient the nature and biology of endometrial cancer, we reviewed risk factors for endometrial cancer such as obesity, nulliparity and estrogen exposure. We discussed the grading system of endometrial cancer and the importance of surgical staging in regards to prognosis and treatment options. Discussed in detail recommendation for definitive surgical management of endometrial cancer to include hysterectomy, bilateral salpingoophorectomy, sentinel lymph node dissection, possible pelvic and para aortic lymph node dissection, and possible omentectomy. Other management options including progesterone and radiation were discussed with these are not recommended primarily.  - I outlined recommendation for minimally invasive approach utilizing the robotic surgical sytem, although we also discussed traditional laparoscopy as well as laparotomy. Benefits of minimally invasive approach were discussed in detail, as well as those of robotic surgery. Risks of surgery include but are not limited to pain, bleeding, infection, lymphedema and damage to nearby organs including bowel, bladder, ureters and blood vessels that would require further surgery.   - Also discussed that pending pathology, we may recommend adjuvant therapy post-operatively to include radiation or chemotherapy or a combination of the two.  - Plan: RA TLH, BSO, SLND, possible PPALND  - The risks, benefits and alternatives of the planned procedure have been discussed with the patient. Risks for surgery were outlined at length including but not limited to pain, bleeding, infection, lymphedema, injury to surrounding organs including bowel, bladder, ureters, large blood vessels, and nerves. Also outlined possible need for blood transfusion with its associated risks to include but not limited to transfusion  reaction, transmission of HIV and/or hepatitis. I also outlined risk for thromboembolic events and death. Patients questions were answered at length. She voiced understanding to both indications for surgery and risks. Consents signed.     2) Type II DM, HTN, Obesity with BMI 47.2 and decreased functional capacity (takes a long time to walk 1 block)  - s/p PCP and cardiology clearance for robotic hysterectomy  - Now that she is not in so much pain and bleeding, her functional status has significantly improved.     Lab Results   Component Value Date    WBC 12.7 (H) 08/17/2017    RBC 4.34 08/17/2017    HGB 11.9 08/17/2017    HGB 12.5 05/31/2017    HCT 38.2 08/17/2017    MCV 88.0 08/17/2017    MCHC 31.2 (L) 08/17/2017    RDW 12.6 08/17/2017    PLT 274 08/17/2017    MPV 11.4 08/17/2017     Lab Results   Component Value Date    BUN 15 08/17/2017    CREAT 0.80 08/17/2017    CL 106 08/17/2017    NA 146 (H) 08/17/2017    K 3.8 08/17/2017    [place] 9.7 08/17/2017    TBILI 0.31 08/17/2017    ALB 4.3 08/17/2017    TP 7.6 08/17/2017    AST 18 08/17/2017    ALK 50 08/17/2017    BICARB 26 08/17/2017    ALT 17 08/17/2017    GLU 110 (H) 08/17/2017       ICD-10-CM ICD-9-CM   1. Endometrial cancer (CMS-HCC) C54.1 182.0   2. Type 2 diabetes mellitus without complication, without long-term current use of insulin (CMS-HCC) E11.9 250.00   3. Hypertension, unspecified type I10 401.9  No orders of the defined types were placed in this encounter.    CC requesting provider: [place]. Pervis Hocking

## 2017-09-25 NOTE — Patient Instructions (Signed)
Plan: Follow up with Dr. Coralee Pesa 2 weeks post op.  *Your surgery is scheduled for September 23rd, please call Lavella Lemons with any questions.  1. Pre-op instructions:   a) CHG wash for abdomen (please follow the written instructions provided to you)   b) NPO (No food or drink) after midnight the day before surgery.   c) No NSAIDs (Ibuprofen, Advil, Aleve, Motrin, Naproxen, etc), Aspirin or fish oils 7 days before surgery.     [place] [address] Phone List for Patients:     Clinic Hours of Operation: Monday-Friday 8:00- 5:00p.m. (closed holidays and weekends)  Infusion Center Hours: Monday-Friday 7:00-7:30 p.m.; Sat-Sun 8:00-4:30pm.  3 West (In-patient Unit, [facility]) Hours: Open 24 hours      AFTER HOURS EMERGENCY NUMBER    [phone]     **Ask for the GynecologyOncology Physician On-Call.    Physician: Sherryl Barters, MD    Nurse Case Manager: Markus Jarvis RN, OCN   Phone#  [phone]     Fax# [phone]    [email or web address] to review information regarding Advance Directives.    Administrative Assistant: Janene Harvey     Phone#    [phone]     Fax# [phone]  **Please call prior to coming in to pick up forms or letters to insure their availability; as they take 24-48 hours to prepare.     [place] Patients: Medical Assistant: Sidney Ace - coordinates surgical scheduling and post-op follow up.  Call the [facility] office [phone] and ask to speak with Tanya.       Social Worker: Leanna Battles, LCSW  [phone]  For emotional, social, spiritual and practical needs that may arise throughout cancer treatment and recovery.    Imaging Scheduling: [phone]    PET Scheduling: [phone]     Information Desk: [phone]  ** General information: directions, telephone numbers, or available services.     [facility]: [phone]  **Call to schedule, cancel, or reschedule clinic appointments.        Infusion Center: [phone] Option 2  ** Call to schedule, cancel, or reschedule chemotherapy appointments.    Interventional Radiology: [phone]     Procedure Suite: [phone]     Radiation Oncology: [phone]     Clinical Trials Office: [phone]    [place] at [facility]: [phone]    Medical Records: [phone]    Financial Counselor: Esperanza Richters  Phone: [phone]  Fax [phone]

## 2017-09-25 NOTE — Interdisciplinary (Signed)
Nursing assessment:   57 y.o. female with endometrial cancer, presents for pre-op consents and teaching with Dr. Coralee Pesa.  Assessment performed by provider.  Allergies and medications updated as indicated per patient intake form.    Wellbeing Assessment :   Wellbeing screening not completed, contact information provided to patient should needs arise.   Barriers to learning: none.     Interventions:  Supportive care given. Personal difficulties related to treatment addressed. Patient/family concerns addressed.     Education:  Contact information, AVS given and reviewed. Questions answered to patient's satisfaction and understanding.    Plan: Follow up with Dr. Coralee Pesa 2 weeks post op.  *Your surgery is scheduled for September 23rd, please call Lavella Lemons with any questions.  1. Pre-op instructions:   a) CHG wash for abdomen (please follow the written instructions provided to you)   b) NPO (No food or drink) after midnight the day before surgery.   c) No NSAIDs (Ibuprofen, Advil, Aleve, Motrin, Naproxen, etc), Aspirin or fish oils 7 days before surgery.

## 2017-09-26 ENCOUNTER — Encounter (INDEPENDENT_AMBULATORY_CARE_PROVIDER_SITE_OTHER): Payer: Self-pay | Admitting: Family Medicine

## 2017-10-03 ENCOUNTER — Encounter (INDEPENDENT_AMBULATORY_CARE_PROVIDER_SITE_OTHER): Payer: Self-pay

## 2017-10-05 ENCOUNTER — Telehealth (HOSPITAL_BASED_OUTPATIENT_CLINIC_OR_DEPARTMENT_OTHER): Payer: Self-pay | Admitting: Gynecologic Oncology

## 2017-10-05 NOTE — Telephone Encounter (Signed)
3-way call from patient and Verdis Frederickson from [facility] in regards to her surgery on Monday.    Verdis Frederickson explained that the CPT code for the surgery is listed as inpatient while the Josem Kaufmann is listed as outpatient.    They are requesting that these codes be corrected ASAP.  Pt's phone: [phone]  Maria's phone: [phone]

## 2017-10-08 ENCOUNTER — Encounter (HOSPITAL_BASED_OUTPATIENT_CLINIC_OR_DEPARTMENT_OTHER): Payer: Self-pay | Admitting: Gynecologic Oncology

## 2017-10-08 DIAGNOSIS — G8918 Other acute postprocedural pain: Secondary | ICD-10-CM

## 2017-10-08 DIAGNOSIS — N736 Female pelvic peritoneal adhesions (postinfective): Secondary | ICD-10-CM

## 2017-10-08 DIAGNOSIS — E669 Obesity, unspecified: Secondary | ICD-10-CM

## 2017-10-08 DIAGNOSIS — C541 Malignant neoplasm of endometrium: Secondary | ICD-10-CM

## 2017-10-08 HISTORY — PX: ABDOMINAL HYSTERECTOMY: SHX81

## 2017-10-08 HISTORY — PX: ROBOTIC ASSISTED GYNECOLOGIC PROCEDURE: SHX2361

## 2017-10-08 HISTORY — PX: ROBOTIC ASSISTED HYSTERECTOMY: SHX2362

## 2017-10-08 MED ORDER — IBUPROFEN 600 MG OR TABS
600.0000 mg | ORAL_TABLET | Freq: Four times a day (QID) | ORAL | 0 refills | Status: DC | PRN
Start: 2017-10-08 — End: 2018-01-15

## 2017-10-08 MED ORDER — HYDROCODONE-ACETAMINOPHEN 5-325 MG OR TABS
1.0000 | ORAL_TABLET | Freq: Four times a day (QID) | ORAL | 0 refills | Status: DC | PRN
Start: 2017-10-08 — End: 2017-10-09

## 2017-10-08 NOTE — Telephone Encounter (Signed)
Attempted to return call. No answer, unable to leave message

## 2017-10-09 ENCOUNTER — Encounter (INDEPENDENT_AMBULATORY_CARE_PROVIDER_SITE_OTHER): Payer: Self-pay | Admitting: Gynecologic Oncology

## 2017-10-09 ENCOUNTER — Telehealth (INDEPENDENT_AMBULATORY_CARE_PROVIDER_SITE_OTHER): Payer: Self-pay

## 2017-10-09 DIAGNOSIS — Z09 Encounter for follow-up examination after completed treatment for conditions other than malignant neoplasm: Secondary | ICD-10-CM

## 2017-10-09 DIAGNOSIS — C541 Malignant neoplasm of endometrium: Secondary | ICD-10-CM

## 2017-10-09 NOTE — Telephone Encounter (Signed)
Call to [facility] to ck status as was discharged home post op yesterday, want to ensure pain is controlled; reached vmail, detailed message left, requested she call office tomorrow with update, or if any concerns she can call the after hours MD on call # and they will assist.

## 2017-10-15 ENCOUNTER — Encounter (HOSPITAL_BASED_OUTPATIENT_CLINIC_OR_DEPARTMENT_OTHER): Payer: Self-pay

## 2017-10-16 ENCOUNTER — Encounter (INDEPENDENT_AMBULATORY_CARE_PROVIDER_SITE_OTHER): Payer: Self-pay | Admitting: Gynecologic Oncology

## 2017-10-16 DIAGNOSIS — R350 Frequency of micturition: Secondary | ICD-10-CM

## 2017-10-16 NOTE — Telephone Encounter (Signed)
Returned call to [place], advised UA order faxed to lab specified at [phone].  She states it is now starting to get painful when she urinates at times.  Advised that I will let Dr. Coralee Pesa know, but if she is able to send abx, she can not take them until the urine specimen has been provided at the lab.  Understanding verbalized.  Renesmee then asked about her path from LN, advised Dr. Coralee Pesa will communicate results.  Understanding verbalized.     Discussed with Dr. Coralee Pesa, states we can no longer order abx without UA results, she will send Neko a message about her LN path.

## 2017-10-16 NOTE — Telephone Encounter (Signed)
Call from patient to check status of lab orders.  She has the lab and contact info listed in her MyChart message below.    Patient is anxious to get status update because she would be going first thing tomorrow morning as that is the only time she has a ride.    Pt can be reached at phone [phone].

## 2017-10-17 ENCOUNTER — Encounter (INDEPENDENT_AMBULATORY_CARE_PROVIDER_SITE_OTHER): Payer: Self-pay | Admitting: Gynecologic Oncology

## 2017-10-18 ENCOUNTER — Encounter (INDEPENDENT_AMBULATORY_CARE_PROVIDER_SITE_OTHER): Payer: Self-pay

## 2017-10-18 ENCOUNTER — Encounter (INDEPENDENT_AMBULATORY_CARE_PROVIDER_SITE_OTHER): Payer: Self-pay | Admitting: Physician Assistant

## 2017-10-18 ENCOUNTER — Ambulatory Visit (INDEPENDENT_AMBULATORY_CARE_PROVIDER_SITE_OTHER): Admitting: Physician Assistant

## 2017-10-18 ENCOUNTER — Encounter (INDEPENDENT_AMBULATORY_CARE_PROVIDER_SITE_OTHER): Payer: Self-pay | Admitting: Gynecologic Oncology

## 2017-10-18 VITALS — BP 140/78 | HR 80 | Temp 98.7°F | Resp 16 | Wt 259.0 lb

## 2017-10-18 LAB — UA, CHEM ONLY POCT
Bilirubin: NEGATIVE
Glucose: NEGATIVE
Ketones: NEGATIVE
Nitrite: NEGATIVE
Specific Gravity: 1.015 (ref 1.002–1.030)
pH: 6.5 (ref 5–8)

## 2017-10-18 MED ORDER — CEFTRIAXONE SODIUM 1 GM IJ SOLR
1000.0000 mg | Freq: Once | INTRAMUSCULAR | Status: AC
Start: 2017-10-18 — End: 2017-10-18
  Administered 2017-10-18: 1000 mg via INTRAMUSCULAR

## 2017-10-18 MED ORDER — NITROFURANTOIN MONOHYD MACRO 100 MG OR CAPS
100.0000 mg | ORAL_CAPSULE | Freq: Two times a day (BID) | ORAL | 0 refills | Status: DC
Start: 2017-10-18 — End: 2018-01-15

## 2017-10-18 MED ORDER — PHENAZOPYRIDINE HCL 100 MG OR TABS
100.0000 mg | ORAL_TABLET | Freq: Three times a day (TID) | ORAL | 0 refills | Status: DC
Start: 2017-10-18 — End: 2018-01-15

## 2017-10-19 NOTE — Telephone Encounter (Signed)
From: Hedda Slade  To: Minette Headland, MD  Sent: 10/18/2017 3:50 AM PDT  Subject: 20-Other    Dr. Coralee Pesa,  This morning I saw very pronounced blood in my urine. I am going to contact my PCP Dr. Cheron Schaumann to see if I can get in to see him this morning. Will keep you updated as we progress.    Kathleen May.

## 2017-10-20 LAB — URINE CULTURE

## 2017-10-22 ENCOUNTER — Encounter (INDEPENDENT_AMBULATORY_CARE_PROVIDER_SITE_OTHER): Payer: Self-pay | Admitting: Physician Assistant

## 2017-10-23 ENCOUNTER — Ambulatory Visit (INDEPENDENT_AMBULATORY_CARE_PROVIDER_SITE_OTHER): Admitting: Physician Assistant

## 2017-10-23 ENCOUNTER — Ambulatory Visit (INDEPENDENT_AMBULATORY_CARE_PROVIDER_SITE_OTHER): Payer: BLUE CROSS/BLUE SHIELD | Admitting: Gynecologic Oncology

## 2017-10-23 ENCOUNTER — Encounter (INDEPENDENT_AMBULATORY_CARE_PROVIDER_SITE_OTHER): Payer: Self-pay | Admitting: Gynecologic Oncology

## 2017-10-23 VITALS — BP 124/68 | HR 95 | Temp 98.6°F | Resp 16 | Ht 62.0 in | Wt 258.0 lb

## 2017-10-23 DIAGNOSIS — C541 Malignant neoplasm of endometrium: Secondary | ICD-10-CM

## 2017-10-23 DIAGNOSIS — Z09 Encounter for follow-up examination after completed treatment for conditions other than malignant neoplasm: Secondary | ICD-10-CM

## 2017-10-23 DIAGNOSIS — Z6841 Body Mass Index (BMI) 40.0 and over, adult: Secondary | ICD-10-CM

## 2017-10-23 NOTE — Interdisciplinary (Signed)
Nursing assessment:   57 y.o. female with endometrial cancer, presents for 2 week post-op visit with Dr. Coralee Pesa. Sx 10/08/17 RA TLH BSO SLND.  Assessment performed by provider.  Allergies and medications updated as indicated per patient intake form.    Wellbeing Assessment :   Wellbeing screening not completed, contact information provided to patient should needs arise.   Barriers to learning: none.     Interventions:  Supportive care given. Personal difficulties related to treatment addressed. Patient/family concerns addressed.     Education:  Contact information, AVS given and reviewed. Questions answered to patient's satisfaction and understanding.    Plan: Follow up with Dr. Coralee Pesa in 4 weeks.

## 2017-10-23 NOTE — Patient Instructions (Addendum)
Plan: Follow up with Dr. Binder in 4 weeks.     [facility] [address] [place] [address] Phone List for Patients:     Clinic Hours of Operation: Monday-Friday 8:00- 5:00p.m. (closed holidays and weekends)  Infusion Center Hours: Monday-Friday 7:00-7:30 p.m.; Sat-Sun 8:00-4:30pm.  3 West (In-patient Unit, [facility]) Hours: Open 24 hours      AFTER HOURS EMERGENCY NUMBER    [phone]     **Ask for the GynecologyOncology Physician On-Call.    Physician: Pratibha Binder, MD    Nurse Case Manager: Kim Katherine Syme RN, OCN   Phone#  [phone]     Fax# [phone]    [email or web address] to review information regarding Advance Directives.    Administrative Assistant: Wendy Crosby     Phone#    [phone]     Fax# [phone]  **Please call prior to coming in to pick up forms or letters to insure their availability; as they take 24-48 hours to prepare.     [place] Patients: Medical Assistant: Tanya Johnson - coordinates surgical scheduling and post-op follow up.  Call the [place] office [phone] and ask to speak with Tanya.       Social Worker: Margaret Sheridan, LCSW  [phone]  For emotional, social, spiritual and practical needs that may arise throughout cancer treatment and recovery.    Imaging Scheduling: [phone]    PET Scheduling: [phone]     Information Desk: [phone]  ** General information: directions, telephone numbers, or available services.     Cancer Center Clinic: [phone]  **Call to schedule, cancel, or reschedule clinic appointments.     Infusion Center: [phone] Option 2  ** Call to schedule, cancel, or reschedule chemotherapy appointments.    Interventional Radiology: [phone]     Procedure Suite: [phone]     Radiation Oncology: [phone]     Clinical Trials Office: [phone]    Retail Pharmacy at [place]: [phone]    Medical Records: [phone]    Financial Counselor:  Marisela Villanueva  Phone: [phone]  Fax [phone]

## 2017-10-23 NOTE — Progress Notes (Signed)
Demographics:  Date: October 23, 2017   Patient Name: Kathleen May   DOB: [birth year]  Age: 57 year old  Sex: female    Requesting Physician:      Anselm Pancoast    Primary Care Physician:   Margaree Mackintosh    Reason for visit: Endometrial Cancer    Clinic Location:      [facility] [place] [address]    History of Present Illness:  Kathleen May, is a 57 year old here for f/u of Endometrial cancer.    Today she presents with her husband. She is s/p surgery and is doing well post-op    Cancer Hx:  06/08/17: Pelvic U/S showed uterus is normal in size, shape and echogenicity (normal anteroposterior (AP) diameter is around 3-7 cm with a normal uterine length of about 5-12 cm. Endometrium: The endometrial stripe is measuring 16 mm thick. Ovaries: Both ovaries are not visualized due to overlying bowel gas. There is a small amount of fluid in the cervix. Nabothian cysts are noted. Images reviewed. Uterus is 10x6x4cm.  08/07/17: Endometrial curettings: Grade 1 endometrioid adenocarcinoma, MMR intact.  10/08/17: RA TLH, BSO, BSLND -> Stage IA, Gr 1 endometrioid adenocarcinoma, 2.5x2cm size, no MI, no LVSI, washings negative, RSLN neg, Left SLN = benign adipose tissue, MMR intact    PMHx/PSHx/PObGynHx/FHx/SHx: reviewed and unchanged from 09/25/17 other than what is noted in HPI and Cancer Hx.    CANCER SCREENING  Last pap: 07/09/17, NILM, HPV neg  Abnormal paps: None  Mammogram: 07/26/17 wnl per patient  Colonoscopy: Never    Current Outpatient Medications   Medication Sig   . APPLE CIDER VINEGAR PO Take 450 mg by mouth.   . Cholecalciferol (VITAMIN D3) 1000 units tablet Take 1,000 Units by mouth daily.   . Garlic 9381 MG CAPS    . ibuprofen (MOTRIN) 600 MG tablet Take 1 tablet (600 mg) by mouth every 6 hours as needed for Mild Pain (Pain Score 1-3) or Moderate Pain (Pain Score 4-6).   Marland Kitchen lisinopril-hydrochlorothiazide (ZESTORETIC) 20-25 MG tablet Take 1  tablet by mouth daily.   . metFORMIN (GLUCOPHAGE XR) 500 MG XR tablet Take 1 tablet (500 mg) by mouth daily.   . Multiple Vitamins-Minerals (MULTIVITAMIN ADULT PO) Take 1 tablet by mouth daily.   . natural supplement Take 1 tablet by mouth daily.   . nitrofurantoin monohydrate (MACROBID) 100 MG capsule Take 1 capsule (100 mg) by mouth 2 times daily.   . Omega-3 Fatty Acids (SALMON OIL-1000) 200 MG CAPS Take 1 tablet by mouth daily.   . phenazopyridine (PYRIDIUM) 100 MG tablet Take 1 tablet (100 mg) by mouth 3 times daily.   . Turmeric 500 MG TABS    . Wheat Dextrin (BENEFIBER) CHEW      No current facility-administered medications for this visit.        Allergies   Allergen Reactions   . Latex Rash   . Codeine Other       Review of Systems -   Constitutional: negative, no fatigue.   Neuro: negative, no HAs.  ENT: negative, no change in hearing.  Eyes: negative, no change in vision.  CV: negative, no chest pain.   Resp: negative, no SOB.   GI: negative, no abdominal bloating/distension, no N/V or anorexia, no constipation/diarrhea, no BRBPR.   GU: negative.   Musculoskeletal: negative.  Endo: negative.  Heme/Lymphatic: negative.   GYN:  negative.  Psych: negative.    Physical Examination:  Vitals: BP 124/68 (BP Location: Left arm, BP Patient Position: Sitting, BP cuff size: Large)   Pulse 95   Temp 98.6 F (37 C) (Oral)   Resp 16   Ht '5\' 2"'$  (1.575 m)   Wt 117 kg (258 lb)   SpO2 96%   BMI 47.19 kg/m   Neuro: Alert and oriented.  General: No acute distress, pleasant female.   Eyes: EOMI, non-icteric sclera.  Neck: no thyromegaly, no lymphadenopathy.  Cardiovascular: RRR, no murmurs. No peripheral edema.  Pulm: Clear to ausculation bilaterally, no crackles, good air movement.  Back/Musculoskeletcal: No costovertebral angle tenderness.   Abd: Soft, obese abdomen, incision c/d/i/  Skin: Warm and well perfused. No rashes.      ASSESSMENT/PLAN:  Kathleen May, is a 57 year old here for f/u of endometrial cancer    1)  Stage IA, Gr 1, no MI, no LVSI, MMR intact Endometrial cancer  - no adjuvant therapy recommended,  - discussed in detail with patient that this cancer is likely due to obesity and uncontrolled diabetes.  Discussed with patient that is very imperative that she improve her diet habits and start exercising and order to lose weight.  - Doing well, discussed return precautions.  - RTC in 4 weeks for cuff check.         ICD-10-CM ICD-9-CM   1. Endometrial cancer (CMS-HCC) C54.1 182.0   2. Class 3 severe obesity with serious comorbidity and body mass index (BMI) of 45.0 to 49.9 in adult, unspecified obesity type (CMS-HCC) E66.01 278.01    Z68.42 V85.42       No orders of the defined types were placed in this encounter.    CC requesting provider: Lebo. Pervis Hocking

## 2017-10-24 ENCOUNTER — Encounter (INDEPENDENT_AMBULATORY_CARE_PROVIDER_SITE_OTHER): Payer: Self-pay

## 2017-11-01 ENCOUNTER — Other Ambulatory Visit (INDEPENDENT_AMBULATORY_CARE_PROVIDER_SITE_OTHER): Payer: Self-pay | Admitting: Physician Assistant

## 2017-11-02 ENCOUNTER — Other Ambulatory Visit (INDEPENDENT_AMBULATORY_CARE_PROVIDER_SITE_OTHER): Payer: Self-pay | Admitting: Physician Assistant

## 2017-11-02 DIAGNOSIS — E119 Type 2 diabetes mellitus without complications: Principal | ICD-10-CM

## 2017-11-02 MED ORDER — EASY STEP GLUCOSE MONITOR W/DEVICE KIT
PACK | Status: DC
Start: ? — End: 2017-11-02

## 2017-11-02 MED ORDER — EASY STEP GLUCOSE MONITOR W/DEVICE KIT
PACK | 0 refills | Status: DC
Start: 2017-11-02 — End: 2018-01-29

## 2017-11-02 MED ORDER — GLUCOSE BLOOD VI STRP
1.00 | ORAL_STRIP | Status: DC
Start: ? — End: 2017-11-02

## 2017-11-02 MED ORDER — GLUCOSE BLOOD VI STRP
1.0000 | ORAL_STRIP | Freq: Every day | 0 refills | Status: DC
Start: 2017-11-02 — End: 2018-04-30

## 2017-11-20 ENCOUNTER — Encounter (INDEPENDENT_AMBULATORY_CARE_PROVIDER_SITE_OTHER): Payer: Self-pay | Admitting: Gynecologic Oncology

## 2017-11-20 ENCOUNTER — Ambulatory Visit (INDEPENDENT_AMBULATORY_CARE_PROVIDER_SITE_OTHER): Payer: BLUE CROSS/BLUE SHIELD | Admitting: Gynecologic Oncology

## 2017-11-20 VITALS — BP 142/83 | HR 91 | Temp 99.3°F | Resp 16 | Ht 62.0 in | Wt 257.8 lb

## 2017-11-20 DIAGNOSIS — Z90710 Acquired absence of both cervix and uterus: Principal | ICD-10-CM

## 2017-11-20 DIAGNOSIS — Z90722 Acquired absence of ovaries, bilateral: Principal | ICD-10-CM

## 2017-11-20 DIAGNOSIS — Z6841 Body Mass Index (BMI) 40.0 and over, adult: Secondary | ICD-10-CM

## 2017-11-20 DIAGNOSIS — C541 Malignant neoplasm of endometrium: Secondary | ICD-10-CM

## 2017-11-20 DIAGNOSIS — Z9079 Acquired absence of other genital organ(s): Principal | ICD-10-CM

## 2017-11-20 DIAGNOSIS — Z09 Encounter for follow-up examination after completed treatment for conditions other than malignant neoplasm: Secondary | ICD-10-CM

## 2017-11-20 NOTE — Patient Instructions (Signed)
Plan:  1. Referral for weight management program and colonoscopy  2. Return to clinic in 3 months.    [place] Phone List for Patients     Clinic Hours of Operation: Monday-Friday 8:00- 5:00p.m. (closed holidays and weekends)  Infusion Center Hours: Monday-Friday 7:00-7:30 p.m.; Sat-Sun 8:00-4:30pm.  3 West (In-patient Unit, [facility]) Hours: Open 24 hours      AFTER HOURS EMERGENCY NUMBER    [phone]     **Ask for the GynecologyOncology Physician On-Call.      Physician: Sherryl Barters, MD    Nurse Case Manager: Markus Jarvis, RN  Phone#  [phone]    Fax# [phone]    Administrative Assistant: Janene Harvey     Phone#    [phone]     Fax# [phone]  **Please call prior to coming in to pick up forms or letters to insure their availability; as they take 24-48 hours to prepare.      Social Worker: Roseanna Rainbow LCSW  [phone]  For emotional, social, spiritual and practical needs that may arise throughout cancer treatment and recovery.     Information Desk [phone]  ** General information: directions, telephone numbers, or available services.     [facility] [phone]  **Call to schedule, cancel, or reschedule clinic appointments.     Infusion Center [phone]   ** Call to schedule, cancel, or reschedule chemotherapy appointments.     Procedure Suite [phone]     Radiation Oncology [phone]  ** Call to schedule, cancel, or reschedule radiation appointments.     Clinical Trials Office [phone]     [place] [facility], [facility]   [phone]

## 2017-11-20 NOTE — Progress Notes (Signed)
Demographics:  Date: November 20, 2017   Patient Name: Kathleen May   DOB: 03/01/1960  Age: 57 year old  Sex: female    Requesting Physician:      Anselm Pancoast    Primary Care Physician:   Margaree Mackintosh    Reason for visit: Endometrial Cancer    Clinic Location:      [facility] [place] [address]    History of Present Illness:  Kathleen May, is a 57 year old here for f/u of Endometrial cancer.    Today she presents with her husband. She is s/p surgery and is doing well post-op    Cancer Hx:  06/08/17: Pelvic U/S showed uterus is normal in size, shape and echogenicity (normal anteroposterior (AP) diameter is around 3-7 cm with a normal uterine length of about 5-12 cm. Endometrium: The endometrial stripe is measuring 16 mm thick. Ovaries: Both ovaries are not visualized due to overlying bowel gas. There is a small amount of fluid in the cervix. Nabothian cysts are noted. Images reviewed. Uterus is 10x6x4cm.  08/07/17: Endometrial curettings: Grade 1 endometrioid adenocarcinoma, MMR intact.  10/08/17: RA TLH, BSO, BSLND -> Stage IA, Gr 1 endometrioid adenocarcinoma, 2.5x2cm size, no MI, no LVSI, washings negative, RSLN neg, Left SLN = benign adipose tissue, MMR intact    PMHx/PSHx/PObGynHx/FHx/SHx: reviewed and unchanged from 09/25/17 other than what is noted in HPI and Cancer Hx.    CANCER SCREENING  Last pap: 07/09/17, NILM, HPV neg  Abnormal paps: None  Mammogram: 07/26/17 wnl per patient  Colonoscopy: Never    Current Outpatient Medications   Medication Sig   . APPLE CIDER VINEGAR PO Take 450 mg by mouth.   . Blood Glucose Monitoring Suppl (EASY STEP GLUCOSE MONITOR) w/Device KIT Use daily prn   . Cholecalciferol (VITAMIN D3) 1000 units tablet Take 1,000 Units by mouth daily.   . Garlic 2703 MG CAPS    . glucose blood test strip 1 strip by Other route every morning (before breakfast).   Marland Kitchen ibuprofen (MOTRIN) 600 MG tablet Take 1 tablet (600  mg) by mouth every 6 hours as needed for Mild Pain (Pain Score 1-3) or Moderate Pain (Pain Score 4-6).   Marland Kitchen lisinopril-hydrochlorothiazide (ZESTORETIC) 20-25 MG tablet Take 1 tablet by mouth daily.   . metFORMIN (GLUCOPHAGE XR) 500 MG XR tablet Take 1 tablet (500 mg) by mouth daily.   . Multiple Vitamins-Minerals (MULTIVITAMIN ADULT PO) Take 1 tablet by mouth daily.   . natural supplement Take 1 tablet by mouth daily.   . nitrofurantoin monohydrate (MACROBID) 100 MG capsule Take 1 capsule (100 mg) by mouth 2 times daily.   . Omega-3 Fatty Acids (SALMON OIL-1000) 200 MG CAPS Take 1 tablet by mouth daily.   . phenazopyridine (PYRIDIUM) 100 MG tablet Take 1 tablet (100 mg) by mouth 3 times daily.   . Turmeric 500 MG TABS    . Wheat Dextrin (BENEFIBER) CHEW      No current facility-administered medications for this visit.        Allergies   Allergen Reactions   . Latex Rash   . Codeine Other       Review of Systems -   Constitutional: negative, no fatigue.   Neuro: negative, no HAs.  ENT: negative, no change in hearing.  Eyes: negative, no change in vision.  CV: negative, no chest pain.   Resp: negative, no SOB.  GI: negative, no abdominal bloating/distension, no N/V or anorexia, no constipation/diarrhea, no BRBPR.   GU: negative.   Musculoskeletal: negative.  Endo: negative.  Heme/Lymphatic: negative.   GYN: negative.  Psych: negative.    Physical Examination:  Vitals: BP 142/83 (BP Location: Left arm, BP Patient Position: Sitting, BP cuff size: Regular)   Pulse 91   Temp 99.3 F (37.4 C) (Oral)   Resp 16   Ht '5\' 2"'$  (1.575 m)   Wt 116.9 kg (257 lb 12.8 oz)   SpO2 97%   BMI 47.15 kg/m    Date Weight Recorded 11/20/2017 10/23/2017 10/18/2017 09/25/2017 09/10/2017 08/17/2017   Metric 116.937 kg 117.028 kg 117.482 kg 117.754 kg 115.577 kg 117.164 kg   Pounds/Ounces 257 lb 12.8 oz 258 lb 259 lb 259 lb 9.6 oz 254 lb 12.8 oz 258 lb 4.8 oz   Neuro: Alert and oriented.  General: No acute distress, pleasant female.   Eyes:  EOMI, non-icteric sclera.  Neck: no thyromegaly, no lymphadenopathy.  Cardiovascular: RRR, no murmurs. No peripheral edema.  Pulm: Clear to ausculation bilaterally, no crackles, good air movement.  Back/Musculoskeletcal: No costovertebral angle tenderness.   Abd: Soft, obese abdomen, incision c/d/i/  Skin: Warm and well perfused. No rashes.  Pelvic: NEFG, normal urethra    Vaginal epithelium normal, vaginal cuff healing well with stitches still in place   Vaginal cuff without masses and no adnexal masses, although exam limited by body habitus.    ASSESSMENT/PLAN:  [name], is a 57 year old here for f/u of endometrial cancer    1) Stage IA, Gr 1, no MI, no LVSI, MMR intact Endometrial cancer  - no adjuvant therapy recommended,  - discussed in detail with patient that this cancer is likely due to obesity and uncontrolled diabetes.  Discussed with patient that is very imperative that she improve her diet habits and start exercising and order to lose weight.  - Ordered referral to weight management program and colonoscopy.  - Doing well, discussed return precautions.  - RTC in 3 months.         ICD-10-CM ICD-9-CM   1. Status post total hysterectomy and bilateral salpingo-oophorectomy Z90.710 V88.01    Z90.79 V45.77    Z90.722    2. Endometrial cancer (CMS-HCC) C54.1 182.0   3. Class 3 severe obesity due to excess calories with serious comorbidity and body mass index (BMI) of 45.0 to 49.9 in adult (CMS-HCC) E66.01 278.01    Z68.42 V85.42       Orders Placed This Encounter   Procedures   . Consult/Referral to Bariatric Surgery & Weight Management     CC requesting provider: Lebo. Pervis Hocking

## 2017-12-18 ENCOUNTER — Ambulatory Visit (INDEPENDENT_AMBULATORY_CARE_PROVIDER_SITE_OTHER): Admitting: Family Medicine

## 2017-12-26 LAB — HM COLONOSCOPY

## 2018-01-07 ENCOUNTER — Encounter (INDEPENDENT_AMBULATORY_CARE_PROVIDER_SITE_OTHER): Payer: Self-pay | Admitting: Physician Assistant

## 2018-01-07 ENCOUNTER — Other Ambulatory Visit (INDEPENDENT_AMBULATORY_CARE_PROVIDER_SITE_OTHER): Payer: Self-pay | Admitting: Physician Assistant

## 2018-01-07 DIAGNOSIS — I10 Essential (primary) hypertension: Principal | ICD-10-CM

## 2018-01-07 DIAGNOSIS — E119 Type 2 diabetes mellitus without complications: Secondary | ICD-10-CM

## 2018-01-07 MED ORDER — LISINOPRIL-HYDROCHLOROTHIAZIDE 20-25 MG OR TABS
1.0000 | ORAL_TABLET | Freq: Every day | ORAL | 1 refills | Status: DC
Start: 2018-01-07 — End: 2018-07-04

## 2018-01-07 MED ORDER — METFORMIN HCL 500 MG OR TB24
500.0000 mg | ORAL_TABLET | Freq: Every day | ORAL | 1 refills | Status: DC
Start: 2018-01-07 — End: 2018-07-04

## 2018-01-15 ENCOUNTER — Ambulatory Visit (INDEPENDENT_AMBULATORY_CARE_PROVIDER_SITE_OTHER): Admitting: Family Medicine

## 2018-01-15 ENCOUNTER — Encounter (INDEPENDENT_AMBULATORY_CARE_PROVIDER_SITE_OTHER): Payer: Self-pay | Admitting: Family Medicine

## 2018-01-15 VITALS — BP 140/78 | HR 90 | Temp 97.8°F | Resp 16 | Ht 62.0 in | Wt 260.0 lb

## 2018-01-15 DIAGNOSIS — R319 Hematuria, unspecified: Secondary | ICD-10-CM

## 2018-01-15 DIAGNOSIS — I10 Essential (primary) hypertension: Principal | ICD-10-CM

## 2018-01-17 ENCOUNTER — Telehealth (INDEPENDENT_AMBULATORY_CARE_PROVIDER_SITE_OTHER): Payer: Self-pay | Admitting: Family Medicine

## 2018-01-18 ENCOUNTER — Encounter (INDEPENDENT_AMBULATORY_CARE_PROVIDER_SITE_OTHER): Payer: Self-pay

## 2018-01-29 ENCOUNTER — Telehealth (HOSPITAL_BASED_OUTPATIENT_CLINIC_OR_DEPARTMENT_OTHER): Payer: Self-pay | Admitting: Gynecologic Oncology

## 2018-01-29 ENCOUNTER — Encounter (INDEPENDENT_AMBULATORY_CARE_PROVIDER_SITE_OTHER): Payer: Self-pay | Admitting: Physician Assistant

## 2018-01-29 DIAGNOSIS — E119 Type 2 diabetes mellitus without complications: Principal | ICD-10-CM

## 2018-01-29 MED ORDER — LANCETS MISC
1.0000 | Freq: Every day | 5 refills | Status: DC
Start: 2018-01-29 — End: 2019-02-06

## 2018-01-29 MED ORDER — EASY STEP GLUCOSE MONITOR W/DEVICE KIT
PACK | 0 refills | Status: AC
Start: 2018-01-29 — End: ?

## 2018-01-29 NOTE — Telephone Encounter (Signed)
Call from patient requesting to r/s her f/u appt with Dr Coralee Pesa on 2/4 because she will be out of town.    Patient was advised her call will be returned within 24 hours.    Patient is frustrated because she had called a few numbers before she got the [place] office so she wants to make sure that it will be taken care of by tomorrow.  Her phone: [phone]

## 2018-02-19 ENCOUNTER — Encounter (INDEPENDENT_AMBULATORY_CARE_PROVIDER_SITE_OTHER): Payer: BLUE CROSS/BLUE SHIELD | Admitting: Gynecologic Oncology

## 2018-04-16 ENCOUNTER — Encounter (INDEPENDENT_AMBULATORY_CARE_PROVIDER_SITE_OTHER): Payer: BLUE CROSS/BLUE SHIELD | Admitting: Gynecologic Oncology

## 2018-04-19 ENCOUNTER — Ambulatory Visit (INDEPENDENT_AMBULATORY_CARE_PROVIDER_SITE_OTHER): Admitting: Family Medicine

## 2018-04-29 ENCOUNTER — Encounter (INDEPENDENT_AMBULATORY_CARE_PROVIDER_SITE_OTHER): Payer: Self-pay | Admitting: Physician Assistant

## 2018-04-30 ENCOUNTER — Encounter (INDEPENDENT_AMBULATORY_CARE_PROVIDER_SITE_OTHER): Payer: Self-pay | Admitting: Physician Assistant

## 2018-04-30 MED ORDER — GLUCOSE BLOOD VI STRP
1.0000 | ORAL_STRIP | Freq: Every day | 0 refills | Status: DC
Start: 2018-04-30 — End: 2018-07-09

## 2018-07-03 ENCOUNTER — Encounter (INDEPENDENT_AMBULATORY_CARE_PROVIDER_SITE_OTHER): Payer: Self-pay | Admitting: Physician Assistant

## 2018-07-04 ENCOUNTER — Other Ambulatory Visit (INDEPENDENT_AMBULATORY_CARE_PROVIDER_SITE_OTHER): Payer: Self-pay | Admitting: Physician Assistant

## 2018-07-04 MED ORDER — LISINOPRIL-HYDROCHLOROTHIAZIDE 20-25 MG OR TABS
1.0000 | ORAL_TABLET | Freq: Every day | ORAL | 0 refills | Status: DC
Start: 2018-07-04 — End: 2018-10-04

## 2018-07-04 MED ORDER — METFORMIN HCL 500 MG OR TB24
500.0000 mg | ORAL_TABLET | Freq: Every day | ORAL | 0 refills | Status: DC
Start: 2018-07-04 — End: 2019-01-13

## 2018-07-09 ENCOUNTER — Encounter (INDEPENDENT_AMBULATORY_CARE_PROVIDER_SITE_OTHER): Payer: Self-pay | Admitting: Physician Assistant

## 2018-07-09 ENCOUNTER — Ambulatory Visit: Admitting: Physician Assistant

## 2018-07-09 MED ORDER — GLUCOSE BLOOD VI STRP
1.0000 | ORAL_STRIP | Freq: Every day | 2 refills | Status: DC
Start: 2018-07-09 — End: 2019-05-12

## 2018-07-11 ENCOUNTER — Ambulatory Visit (INDEPENDENT_AMBULATORY_CARE_PROVIDER_SITE_OTHER): Admitting: Physician Assistant

## 2018-07-12 ENCOUNTER — Encounter (INDEPENDENT_AMBULATORY_CARE_PROVIDER_SITE_OTHER): Admitting: Physician Assistant

## 2018-07-15 LAB — SARS-COV-2 RNA (COVID-19), QUALITATIVE NAAT - QUEST: SARS CoV 2 RNA: NOT DETECTED

## 2018-07-16 ENCOUNTER — Telehealth (INDEPENDENT_AMBULATORY_CARE_PROVIDER_SITE_OTHER): Payer: Self-pay | Admitting: Physician Assistant

## 2018-07-26 ENCOUNTER — Ambulatory Visit (INDEPENDENT_AMBULATORY_CARE_PROVIDER_SITE_OTHER): Admitting: Physician Assistant

## 2018-07-26 NOTE — Progress Notes (Signed)
Encounter Date:  07/26/18  11:03 AM   PCP: Alisia Ferrari  MRN: [id number]        HPI:  Kathleen May is a 58 year old female presenting for COVID-19 testing.     Patient presents for drive through [id number] testing. Patient previously evaluated by provider who recommended testing.       PROBLEM  LIST:  Patient Active Problem List   Diagnosis    Essential hypertension    Type 2 diabetes mellitus without complication, without long-term current use of insulin (CMS-HCC)    Type 2 diabetes mellitus without complication (CMS-HCC)    Encounter to discuss test results    Arthralgia, unspecified joint    Hypertension, unspecified type    Dry skin    Postmenopausal bleeding    Right wrist pain    Statin declined    Endometrial cancer (CMS-HCC)    Hematuria, unspecified type    Class 3 severe obesity due to excess calories with serious comorbidity and body mass index (BMI) of 45.0 to 49.9 in adult (CMS-HCC)    Status post total hysterectomy and bilateral salpingo-oophorectomy    COVID-19 ruled out    Trigger little finger of right hand         PAST MEDICAL HISTORY:  Past Medical History:   Diagnosis Date    Abnormal uterine bleeding (AUB) 2019    Started Feb 2019    Arthritis     Diabetes (CMS-HCC)     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    Hypertension        PAST SURGICAL HISTORY:  Past Surgical History:   Procedure Laterality Date    ROBOTIC ASSISTED HYSTERECTOMY  10/08/2017    ROBOTIC ASSISTED GYNECOLOGIC PROCEDURE  10/08/2017    [facility] BSO SLND    CESAREAN SECTION, CLASSIC  1982    2nd 1989    HYSTEROSCOPY W/ POLYPECTOMY      INCISIONAL BREAST BIOPSY Left         FAMILY HISTORY:   Family History   Problem Relation Name Age of Onset    Hypertension Mother      Diabetes Mother      Other Mother      Liver Disease Father      Cancer Father      Breast Cancer Maternal Grandmother      Ovarian Cancer Maternal Aunt           SOCIAL HISTORY:  Social History     Socioeconomic  History    Marital status: Married     Spouse name: Not on file    Number of children: Not on file    Years of education: Not on file    Highest education level: Not on file   Occupational History    Not on file   Social Needs    Financial resource strain: Not on file    Food insecurity:     Worry: Not on file     Inability: Not on file    Transportation needs:     Medical: Not on file     Non-medical: Not on file   Tobacco Use    Smoking status: Former Smoker     Packs/day: 2.00     Last attempt to quit: 2008     Years since quitting: 12.5    Smokeless tobacco: Never Used   Substance and Sexual Activity    Alcohol use: Yes     Frequency: Monthly or less  Drinks per session: 1 or 2     Binge frequency: Less than monthly    Drug use: Never    Sexual activity: Not on file   Lifestyle    Physical activity:     Days per week: Not on file     Minutes per session: Not on file    Stress: Not on file   Relationships    Social connections:     Talks on phone: Not on file     Gets together: Not on file     Attends religious service: Not on file     Active member of club or organization: Not on file     Attends meetings of clubs or organizations: Not on file     Relationship status: Not on file    Intimate partner violence:     Fear of current or ex partner: Not on file     Emotionally abused: Not on file     Physically abused: Not on file     Forced sexual activity: Not on file   Other Topics Concern    Not on file   Social History Narrative    Not on file     Social History     Tobacco Use   Smoking Status Former Smoker    Packs/day: 2.00    Last attempt to quit: 2008    Years since quitting: 12.5   Smokeless Tobacco Never Used      Social History     Substance and Sexual Activity   Alcohol Use Yes    Frequency: Monthly or less    Drinks per session: 1 or 2    Binge frequency: Less than monthly      Social History     Substance and Sexual Activity   Drug Use Never        Immunization History      Administered Date(s) Administered    Influenza Vaccine >=6 Months 11/07/2016, 10/23/2017    Pneumococcal 13 Vaccine (PREVNAR-13) 10/23/2017    Tdap 10/23/2017         CURRENT  MEDICATIONS:  Current Outpatient Medications on File Prior to Visit   Medication Sig Dispense Refill    APPLE CIDER VINEGAR PO Take 450 mg by mouth.      Blood Glucose Monitoring Suppl (EASY STEP GLUCOSE MONITOR) w/Device KIT Use daily prn 1 kit 0    Cholecalciferol (VITAMIN D3) 1000 units tablet Take 1,000 Units by mouth daily.      Garlic 1000 MG CAPS       glucose blood test strip 1 strip by Other route every morning (before breakfast). 100 strip 2    lancets 1 each by Other route every morning (before breakfast). 100 Lancet 5    lisinopril-hydrochlorothiazide (ZESTORETIC) 20-25 MG tablet Take 1 tablet by mouth daily. 90 tablet 0    metFORMIN (GLUCOPHAGE XR) 500 MG XR tablet Take 1 tablet (500 mg) by mouth daily. 90 tablet 0    Multiple Vitamins-Minerals (MULTIVITAMIN ADULT PO) Take 1 tablet by mouth daily.      natural supplement Take 1 tablet by mouth daily.      Omega-3 Fatty Acids (SALMON OIL-1000) 200 MG CAPS Take 1 tablet by mouth daily.      Turmeric 500 MG TABS        No current facility-administered medications on file prior to visit.      No outpatient medications have been marked as taking for the 07/26/18 encounter (Office  Visit) with Saunders Glance, PA.        ALLERGIES:    Allergies   Allergen Reactions    Latex Rash    Codeine Other          REVIEW OF SYSTEMS:  Review of Systems  Unable to perform ROS: Acuity of condition    PHYSICAL EXAM:  There were no vitals filed for this visit.  There is no height or weight on file to calculate BMI.    Physical Exam  Nursing note reviewed.   Constitutional:    General: Not in acute distress.    Apperance: Not toxic-apperaing.  Neurological:    Mental Status: Alert.  Psychiatric:    Mood and Affect: Mood normal.       ASSESSMENT & PLAN:  Diagnoses and all orders for this  visit:    Contact with or exposure to communicable disease      Test completed.   Patient tolerated well.   Aftercare instructions given.    ICD-10-CM ICD-9-CM    1. Contact with or exposure to communicable disease Z20.9 V01.9 COVID-19 AB TEST POCT         No future appointments.        Electronically reviewed and signed by Saunders Glance PA-C      [facility]  [email or web address]

## 2018-07-29 ENCOUNTER — Encounter (INDEPENDENT_AMBULATORY_CARE_PROVIDER_SITE_OTHER): Payer: Self-pay | Admitting: Physician Assistant

## 2018-08-01 ENCOUNTER — Telehealth (INDEPENDENT_AMBULATORY_CARE_PROVIDER_SITE_OTHER): Payer: Self-pay | Admitting: Physician Assistant

## 2018-08-01 LAB — COVID-19 AB TEST POCT
COVID-19 POCT IgG: NEGATIVE
COVID-19 POCT IgM: NEGATIVE

## 2018-08-01 NOTE — Telephone Encounter (Signed)
Patient called in regards to COVID antibody test. Spoke with Junious Dresser and she reviewed the results and she stated it was okay to inform patient that COVID antibody test is negative. Patient understood.

## 2018-08-01 NOTE — Telephone Encounter (Signed)
[facility], patient calling for antibody results.    Reviewed patient's chart, confirmed that patient was seen for AB testing on 07/26/2018    Results for AB testing: Negative for IGG/IGM    Pls advise.

## 2018-10-04 ENCOUNTER — Other Ambulatory Visit (INDEPENDENT_AMBULATORY_CARE_PROVIDER_SITE_OTHER): Payer: Self-pay | Admitting: Physician Assistant

## 2018-10-04 ENCOUNTER — Encounter (INDEPENDENT_AMBULATORY_CARE_PROVIDER_SITE_OTHER): Payer: Self-pay | Admitting: Physician Assistant

## 2018-10-04 MED ORDER — LISINOPRIL-HYDROCHLOROTHIAZIDE 20-25 MG OR TABS
1.0000 | ORAL_TABLET | Freq: Every day | ORAL | 0 refills | Status: DC
Start: 2018-10-04 — End: 2019-01-09

## 2018-10-04 NOTE — Telephone Encounter (Signed)
Pharmacy requesting a refill on Lisinopril.  LOV 08/05/2018  Last filled 07/04/2018

## 2018-10-08 ENCOUNTER — Encounter (INDEPENDENT_AMBULATORY_CARE_PROVIDER_SITE_OTHER): Payer: Self-pay | Admitting: Physician Assistant

## 2018-10-08 ENCOUNTER — Other Ambulatory Visit (INDEPENDENT_AMBULATORY_CARE_PROVIDER_SITE_OTHER): Payer: Self-pay | Admitting: Physician Assistant

## 2018-10-08 NOTE — Telephone Encounter (Signed)
Pharmacy is requesting a refill on Metformin.  LOV 07/26/2018  Last filled 07/04/2018

## 2018-10-08 NOTE — Telephone Encounter (Signed)
No recent labs completed (I don't see any in media either)

## 2018-10-09 NOTE — Telephone Encounter (Signed)
She can stop metformin if she wants to stop taking. No weaning

## 2018-10-11 ENCOUNTER — Ambulatory Visit (INDEPENDENT_AMBULATORY_CARE_PROVIDER_SITE_OTHER): Admitting: Family Medicine

## 2018-10-11 ENCOUNTER — Encounter (INDEPENDENT_AMBULATORY_CARE_PROVIDER_SITE_OTHER): Payer: Self-pay | Admitting: Family Medicine

## 2018-10-11 NOTE — Progress Notes (Signed)
Encounter Date:  10/11/18  4:41 PM   PCP: Alisia Ferrari  MRN: [id number]  DOB: December 08, 1960         HPI:  Kathleen May is a 58 year old female who presents at [facility] office for need for vaccination.    Patient requests for shingrix and flu vaccination.  Discusses benefits and SEs of vaccination.  Reports to have never had chicken pox.      PROBLEM  LIST:  Patient Active Problem List   Diagnosis   . Essential hypertension   . Type 2 diabetes mellitus without complication, without long-term current use of insulin (CMS-HCC)   . Type 2 diabetes mellitus without complication (CMS-HCC)   . Encounter to discuss test results   . Arthralgia, unspecified joint   . Hypertension, unspecified type   . Dry skin   . Postmenopausal bleeding   . Right wrist pain   . Statin declined   . Endometrial cancer (CMS-HCC)   . Hematuria, unspecified type   . Class 3 severe obesity due to excess calories with serious comorbidity and body mass index (BMI) of 45.0 to 49.9 in adult (CMS-HCC)   . Status post total hysterectomy and bilateral salpingo-oophorectomy   . COVID-19 ruled out   . Trigger little finger of right hand         PAST MEDICAL HISTORY:  Past Medical History:   Diagnosis Date   . Abnormal uterine bleeding (AUB) 2019    Started Feb 2019   . Arthritis    . Diabetes (CMS-HCC)    . 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 Hypertension        PAST SURGICAL HISTORY:  Past Surgical History:   Procedure Laterality Date   . ROBOTIC ASSISTED HYSTERECTOMY  10/08/2017   . ROBOTIC ASSISTED GYNECOLOGIC PROCEDURE  10/08/2017    [facility] BSO SLND   . CESAREAN SECTION, CLASSIC  1982    2nd 1989   . HYSTEROSCOPY W/ POLYPECTOMY     . INCISIONAL BREAST BIOPSY Left         FAMILY HISTORY:   Family History   Problem Relation Name Age of Onset   . Hypertension Mother     . Diabetes Mother     . Other Mother     . Liver Disease Father     . Cancer Father     . Breast Cancer Maternal Grandmother     . Ovarian Cancer Maternal Aunt              SOCIAL HISTORY:  Social History     Socioeconomic History   . Marital status: Married     Spouse name: Not on file   . Number of children: Not on file   . Years of education: Not on file   . Highest education level: Not on file   Occupational History   . Not on file   Social Needs   . Financial resource strain: Not on file   . Food insecurity     Worry: Not on file     Inability: Not on file   . Transportation needs     Medical: Not on file     Non-medical: Not on file   Tobacco Use   . Smoking status: Former Smoker     Packs/day: 2.00     Quit date: 2008     Years since quitting: 12.7   . Smokeless tobacco: Never Used   Substance and Sexual Activity   .  Alcohol use: Yes     Frequency: Monthly or less     Drinks per session: 1 or 2     Binge frequency: Less than monthly   . Drug use: Yes     Types: Marijuana   . Sexual activity: Not on file   Lifestyle   . Physical activity     Days per week: Not on file     Minutes per session: Not on file   . Stress: Not on file   Relationships   . Social Wellsite geologist on phone: Not on file     Gets together: Not on file     Attends religious service: Not on file     Active member of club or organization: Not on file     Attends meetings of clubs or organizations: Not on file     Relationship status: Not on file   . Intimate partner violence     Fear of current or ex partner: Not on file     Emotionally abused: Not on file     Physically abused: Not on file     Forced sexual activity: Not on file   Other Topics Concern   . Not on file   Social History Narrative   . Not on file     Social History     Tobacco Use   Smoking Status Former Smoker   . Packs/day: 2.00   . Quit date: 2008   . Years since quitting: 12.7   Smokeless Tobacco Never Used      Social History     Substance and Sexual Activity   Alcohol Use Yes   . Frequency: Monthly or less   . Drinks per session: 1 or 2   . Binge frequency: Less than monthly      Social History     Substance and Sexual Activity    Drug Use Yes   . Types: Marijuana        Immunization History   Administered Date(s) Administered   . (Shingles) Herpes Zoster Recombinant Vaccine [facility]) 10/11/2018   . Influenza Vaccine >=6 Months 11/07/2016, 10/23/2017, 10/11/2018   . Pneumococcal 13 Vaccine (PREVNAR-13) 10/23/2017   . Tdap 10/23/2017         CURRENT  MEDICATIONS:  Current Outpatient Medications on File Prior to Visit   Medication Sig Dispense Refill   . APPLE CIDER VINEGAR PO Take 450 mg by mouth.     . Blood Glucose Monitoring Suppl (EASY STEP GLUCOSE MONITOR) w/Device KIT Use daily prn 1 kit 0   . Cholecalciferol (VITAMIN D3) 1000 units tablet Take 1,000 Units by mouth daily.     . Garlic 1000 MG CAPS      . glucose blood test strip 1 strip by Other route every morning (before breakfast). 100 strip 2   . lancets 1 each by Other route every morning (before breakfast). 100 Lancet 5   . lisinopril-hydrochlorothiazide (ZESTORETIC) 20-25 MG tablet Take 1 tablet by mouth daily. 90 tablet 0   . metFORMIN (GLUCOPHAGE XR) 500 MG XR tablet Take 1 tablet (500 mg) by mouth daily. 90 tablet 0   . Multiple Vitamins-Minerals (MULTIVITAMIN ADULT PO) Take 1 tablet by mouth daily.     . natural supplement Take 1 tablet by mouth daily.     . Omega-3 Fatty Acids (SALMON OIL-1000) 200 MG CAPS Take 1 tablet by mouth daily.     . Turmeric 500 MG TABS  No current facility-administered medications on file prior to visit.      Outpatient Medications Marked as Taking for the 10/11/18 encounter (Office Visit) with Zollie Beckers, Sayre Mazor, DO   Medication Sig Dispense Refill   . APPLE CIDER VINEGAR PO Take 450 mg by mouth.     . Blood Glucose Monitoring Suppl (EASY STEP GLUCOSE MONITOR) w/Device KIT Use daily prn 1 kit 0   . Cholecalciferol (VITAMIN D3) 1000 units tablet Take 1,000 Units by mouth daily.     . Garlic 1000 MG CAPS      . glucose blood test strip 1 strip by Other route every morning (before breakfast). 100 strip 2   . lancets 1 each by Other route every  morning (before breakfast). 100 Lancet 5   . lisinopril-hydrochlorothiazide (ZESTORETIC) 20-25 MG tablet Take 1 tablet by mouth daily. 90 tablet 0   . metFORMIN (GLUCOPHAGE XR) 500 MG XR tablet Take 1 tablet (500 mg) by mouth daily. 90 tablet 0   . Multiple Vitamins-Minerals (MULTIVITAMIN ADULT PO) Take 1 tablet by mouth daily.     . natural supplement Take 1 tablet by mouth daily.     . Omega-3 Fatty Acids (SALMON OIL-1000) 200 MG CAPS Take 1 tablet by mouth daily.     . Turmeric 500 MG TABS           ALLERGIES:    Allergies   Allergen Reactions   . Latex Rash   . Codeine Other          REVIEW OF SYSTEMS:  Review of Systems   Constitutional: Negative for diaphoresis.   HENT: Negative for congestion and nosebleeds.    Eyes: Negative for visual disturbance.   Respiratory: Negative for cough, choking, shortness of breath and wheezing.    Gastrointestinal: Negative for abdominal pain, constipation, diarrhea, nausea and vomiting.   Genitourinary: Negative for frequency and hematuria.   Musculoskeletal: Negative for arthralgias and myalgias.   Skin: Negative for rash.   Neurological: Negative for seizures, syncope and speech difficulty.   Psychiatric/Behavioral: Negative for agitation, behavioral problems and confusion.         PHYSICAL EXAM:   10/11/18  1608   BP: 140/86   Pulse: 68   Resp: 12   Temp: 98.2 F (36.8 C)     Body mass index is 48.69 kg/m.    Ht Readings from Last 1 Encounters:   10/11/18 5\' 2"  (1.575 m)     Wt Readings from Last 1 Encounters:   10/11/18 120.7 kg (266 lb 3.2 oz)           Physical Exam  Vitals signs and nursing note reviewed.   Constitutional:       General: She is not in acute distress.     Appearance: Normal appearance. She is not ill-appearing, toxic-appearing or diaphoretic.   Cardiovascular:      Rate and Rhythm: Normal rate and regular rhythm.      Pulses: Normal pulses.      Heart sounds: Normal heart sounds.   Pulmonary:      Effort: Pulmonary effort is normal.      Breath sounds:  Normal breath sounds.   Musculoskeletal: Normal range of motion.      Right lower leg: Edema present.      Left lower leg: Edema present.      Comments: Non pitting bilateral LE edema   Skin:     General: Skin is warm and dry.   Neurological:  General: No focal deficit present.      Mental Status: She is alert and oriented to person, place, and time. Mental status is at baseline.   Psychiatric:         Mood and Affect: Mood normal.         Behavior: Behavior normal.         Thought Content: Thought content normal.         Judgment: Judgment normal.             ASSESSMENT & PLAN:    Kathleen May was seen today for other.    Diagnoses and all orders for this visit:    Need for vaccination  Comments:  Flucelvax and Shingrix 1 administered to patient in clinic.  Orders:  -     Flu Vaccine (FLUCELVAX)  -     shingles [facility]) - Admin Now  -     shingles (SHINGRIX) - Admin in 2-6 Months; Future          ICD-10-CM ICD-9-CM    1. Need for vaccination  Z23 V05.9 Flu Vaccine (FLUCELVAX)      shingles [facility]) - Admin Now      shingles [facility]) - Admin in 2-6 Months    Flucelvax and Shingrix 1 administered to patient in clinic.           By signing below, I acknowledge that I have reviewed the above note, dictated by me and scribed by Beulah Gandy, for accuracy and edited where necessary. The note accurately reflects the services provided at this  encounter. We retain the right to modify this information in the event of errors. Occasional errors in punctuation, grammar and content may occur.      Electronically reviewed and signed by Alisia Ferrari, D.O.    [facility]  [email or web address]

## 2018-10-11 NOTE — ACP (Advance Care Planning) (Signed)
Pt declined advanced care directive

## 2018-10-14 ENCOUNTER — Encounter (INDEPENDENT_AMBULATORY_CARE_PROVIDER_SITE_OTHER): Payer: Self-pay | Admitting: Family Medicine

## 2018-10-15 ENCOUNTER — Encounter (INDEPENDENT_AMBULATORY_CARE_PROVIDER_SITE_OTHER): Payer: Self-pay

## 2019-01-03 ENCOUNTER — Encounter (INDEPENDENT_AMBULATORY_CARE_PROVIDER_SITE_OTHER): Payer: Self-pay | Admitting: Physician Assistant

## 2019-01-09 ENCOUNTER — Other Ambulatory Visit (INDEPENDENT_AMBULATORY_CARE_PROVIDER_SITE_OTHER): Payer: Self-pay | Admitting: Physician Assistant

## 2019-01-09 MED ORDER — LISINOPRIL-HYDROCHLOROTHIAZIDE 20-25 MG OR TABS
1.0000 | ORAL_TABLET | Freq: Every day | ORAL | 0 refills | Status: DC
Start: 2019-01-09 — End: 2019-03-17

## 2019-01-09 NOTE — Telephone Encounter (Signed)
LOV:07/26/2018  Please advise   Thank you

## 2019-01-13 ENCOUNTER — Ambulatory Visit (INDEPENDENT_AMBULATORY_CARE_PROVIDER_SITE_OTHER): Admitting: Family Medicine

## 2019-01-13 ENCOUNTER — Encounter (INDEPENDENT_AMBULATORY_CARE_PROVIDER_SITE_OTHER): Payer: Self-pay

## 2019-01-13 ENCOUNTER — Encounter (INDEPENDENT_AMBULATORY_CARE_PROVIDER_SITE_OTHER): Payer: Self-pay | Admitting: Family Medicine

## 2019-01-13 VITALS — BP 110/74 | HR 82 | Temp 97.6°F | Resp 18 | Ht 62.0 in | Wt 261.2 lb

## 2019-01-13 NOTE — Assessment & Plan Note (Addendum)
Lab Results   Component Value Date    A1C 6.3 (H) 07/10/2017    A1C 5.2 12/18/2016   Clinically stable  Consider restarting metformin depending on lab results.  Monitor FBG levels regularly at home   Advise moderate weight loss (7% body weight) and regular physical activity (150 min/week) can reduce the risk of diabetes complications and improve glycemic control.     Reduce intake of trans fat/saturated fat (lowers LDL cholesterol and increases HDL cholesterol)   Labs as ordered  RTC precautions advised  Continue to monitor

## 2019-01-13 NOTE — Progress Notes (Signed)
Encounter Date:  01/13/19  8:34 AM   PCP: Alisia Ferrari  MRN: [id number]  DOB: [birth year]         HPI:  Kathleen May is a 58 year old female who presents at [facility] [place] office for immunizations and updates.    Patient as prescribed metformin however is no longer taking it as she is worried about SEs of medication.  Checks glucose everyday.  States her readings are between 115 - 125 every morning.    Patient reports to have gone to an optometrist a month ago.  Was told that she had elevated eye pressure behind one of her eyes.  Did not think that he had any diabetic-related issues.    Patient is due for labs.  States to have completed a mammogram in July 2020.    Patient is scheduled to see her OB/GYN to check for recurrence of endometrial cancer.      PROBLEM  LIST:  Patient Active Problem List   Diagnosis   . Essential hypertension   . Type 2 diabetes mellitus without complication, without long-term current use of insulin (CMS-HCC)   . Type II diabetes mellitus (CMS-HCC)   . Encounter to discuss test results   . Arthralgia, unspecified joint   . Hypertension, unspecified type   . Dry skin   . Postmenopausal bleeding   . Right wrist pain   . Statin declined   . Endometrial cancer (CMS-HCC)   . Hematuria, unspecified type   . Class 3 severe obesity due to excess calories with serious comorbidity and body mass index (BMI) of 45.0 to 49.9 in adult (CMS-HCC)   . Status post total hysterectomy and bilateral salpingo-oophorectomy   . COVID-19 ruled out   . Trigger little finger of right hand   . Glaucoma         PAST MEDICAL HISTORY:  Past Medical History:   Diagnosis Date   . Abnormal uterine bleeding (AUB) 2019    Started Feb 2019   . Arthritis    . Diabetes (CMS-HCC)    . 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 Hypertension        PAST SURGICAL HISTORY:  Past Surgical History:   Procedure Laterality Date   . ROBOTIC ASSISTED HYSTERECTOMY  10/08/2017   . ROBOTIC ASSISTED GYNECOLOGIC PROCEDURE   10/08/2017    [facility] BSO SLND   . CESAREAN SECTION, CLASSIC  1982    2nd 1989   . COLONOSCOPY      12/26/2017   . HYSTEROSCOPY W/ POLYPECTOMY     . INCISIONAL BREAST BIOPSY Left    . Mammogram      08/03/2018   . PAP Smear      08/29/2018        FAMILY HISTORY:   Family History   Problem Relation Name Age of Onset   . Hypertension Mother     . Diabetes Mother     . Other Mother     . Liver Disease Father     . Cancer Father     . Breast Cancer Maternal Grandmother     . Ovarian Cancer Maternal Aunt           SOCIAL HISTORY:  Social History     Socioeconomic History   . Marital status: Married     Spouse name: Not on file   . Number of children: Not on file   . Years of education: Not on file   . Highest education  level: Not on file   Occupational History   . Not on file   Social Needs   . Financial resource strain: Not on file   . Food insecurity     Worry: Not on file     Inability: Not on file   . Transportation needs     Medical: Not on file     Non-medical: Not on file   Tobacco Use   . Smoking status: Former Smoker     Packs/day: 2.00     Quit date: 2008     Years since quitting: 13.0   . Smokeless tobacco: Never Used   Substance and Sexual Activity   . Alcohol use: Yes     Frequency: Monthly or less     Drinks per session: 1 or 2     Binge frequency: Less than monthly   . Drug use: Yes     Types: Marijuana   . Sexual activity: Not on file   Lifestyle   . Physical activity     Days per week: Not on file     Minutes per session: Not on file   . Stress: Not on file   Relationships   . Social Wellsite geologist on phone: Not on file     Gets together: Not on file     Attends religious service: Not on file     Active member of club or organization: Not on file     Attends meetings of clubs or organizations: Not on file     Relationship status: Not on file   . Intimate partner violence     Fear of current or ex partner: Not on file     Emotionally abused: Not on file     Physically abused: Not on file     Forced  sexual activity: Not on file   Other Topics Concern   . Not on file   Social History Narrative   . Not on file     Social History     Tobacco Use   Smoking Status Former Smoker   . Packs/day: 2.00   . Quit date: 2008   . Years since quitting: 13.0   Smokeless Tobacco Never Used      Social History     Substance and Sexual Activity   Alcohol Use Yes   . Frequency: Monthly or less   . Drinks per session: 1 or 2   . Binge frequency: Less than monthly      Social History     Substance and Sexual Activity   Drug Use Yes   . Types: Marijuana        Immunization History   Administered Date(s) Administered   . (Shingles) Herpes Zoster Recombinant Vaccine [facility]) 10/11/2018, 01/13/2019   . Influenza Vaccine >=6 Months 11/07/2016, 10/23/2017, 10/11/2018   . Pneumococcal 13 Vaccine (PREVNAR-13) 10/23/2017   . Pneumococcal 23 Vaccine (PNEUMOVAX-23) 01/13/2019   . Tdap 10/23/2017         CURRENT  MEDICATIONS:  Current Outpatient Medications on File Prior to Visit   Medication Sig Dispense Refill   . APPLE CIDER VINEGAR PO Take 450 mg by mouth.     . Blood Glucose Monitoring Suppl (EASY STEP GLUCOSE MONITOR) w/Device KIT Use daily prn 1 kit 0   . Cholecalciferol (VITAMIN D3) 1000 units tablet Take 1,000 Units by mouth daily.     . Garlic 1000 MG CAPS      .  glucose blood test strip 1 strip by Other route every morning (before breakfast). 100 strip 2   . lancets 1 each by Other route every morning (before breakfast). 100 Lancet 5   . lisinopril-hydrochlorothiazide (ZESTORETIC) 20-25 MG tablet Take 1 tablet by mouth daily. 90 tablet 0   . [DISCONTINUED] lisinopril-hydrochlorothiazide (ZESTORETIC) 20-25 MG tablet Take 1 tablet by mouth daily. 90 tablet 0   . [DISCONTINUED] metFORMIN (GLUCOPHAGE XR) 500 MG XR tablet Take 1 tablet (500 mg) by mouth daily. 90 tablet 0   . Multiple Vitamins-Minerals (MULTIVITAMIN ADULT PO) Take 1 tablet by mouth daily.     . natural supplement Take 1 tablet by mouth daily.     . Omega-3 Fatty Acids  (SALMON OIL-1000) 200 MG CAPS Take 1 tablet by mouth daily.     . Turmeric 500 MG TABS        No current facility-administered medications on file prior to visit.      Outpatient Medications Marked as Taking for the 01/13/19 encounter (Office Visit) with Zollie Beckers, Oluwaseun Cremer, DO   Medication Sig Dispense Refill   . APPLE CIDER VINEGAR PO Take 450 mg by mouth.     . Blood Glucose Monitoring Suppl (EASY STEP GLUCOSE MONITOR) w/Device KIT Use daily prn 1 kit 0   . Cholecalciferol (VITAMIN D3) 1000 units tablet Take 1,000 Units by mouth daily.     . Garlic 1000 MG CAPS      . glucose blood test strip 1 strip by Other route every morning (before breakfast). 100 strip 2   . lancets 1 each by Other route every morning (before breakfast). 100 Lancet 5   . lisinopril-hydrochlorothiazide (ZESTORETIC) 20-25 MG tablet Take 1 tablet by mouth daily. 90 tablet 0   . Multiple Vitamins-Minerals (MULTIVITAMIN ADULT PO) Take 1 tablet by mouth daily.     . natural supplement Take 1 tablet by mouth daily.     . Omega-3 Fatty Acids (SALMON OIL-1000) 200 MG CAPS Take 1 tablet by mouth daily.     . Turmeric 500 MG TABS           ALLERGIES:    Allergies   Allergen Reactions   . Latex Rash   . Codeine Other          REVIEW OF SYSTEMS:  Review of Systems   Constitutional: Negative for diaphoresis.   HENT: Negative for congestion and nosebleeds.    Eyes: Negative for visual disturbance.   Respiratory: Negative for cough, choking, shortness of breath and wheezing.    Gastrointestinal: Negative for abdominal pain, constipation, diarrhea, nausea and vomiting.   Genitourinary: Negative for frequency and hematuria.   Musculoskeletal: Negative for arthralgias and myalgias.   Skin: Negative for rash.   Neurological: Negative for seizures, syncope and speech difficulty.   Psychiatric/Behavioral: Negative for agitation, behavioral problems and confusion.         PHYSICAL EXAM:   01/13/19  0815   BP: 110/74   Pulse: 82   Resp: 18   Temp: 97.6 F (36.4 C)          Body mass index is 47.77 kg/m.    Ht Readings from Last 1 Encounters:   01/13/19 5\' 2"  (1.575 m)     Wt Readings from Last 1 Encounters:   01/13/19 118.5 kg (261 lb 3.2 oz)           Physical Exam  Vitals signs and nursing note reviewed.   Constitutional:  General: She is not in acute distress.     Appearance: Normal appearance. She is not ill-appearing, toxic-appearing or diaphoretic.   Cardiovascular:      Rate and Rhythm: Normal rate and regular rhythm.      Pulses: Normal pulses.      Heart sounds: Normal heart sounds.   Pulmonary:      Effort: Pulmonary effort is normal.      Breath sounds: Normal breath sounds.   Musculoskeletal: Normal range of motion.      Right lower leg: No edema.      Left lower leg: No edema.   Skin:     General: Skin is warm and dry.   Neurological:      General: No focal deficit present.      Mental Status: She is alert and oriented to person, place, and time. Mental status is at baseline.   Psychiatric:         Mood and Affect: Mood normal.         Behavior: Behavior normal.         Thought Content: Thought content normal.         Judgment: Judgment normal.             ASSESSMENT & PLAN:    Emanuelly was seen today for imm/inj.    Diagnoses and all orders for this visit:    Need for vaccination  -     Pneumococcal (PNEUMOVAX-23)  -     Shingles (SHINGRIX)    Glaucoma, unspecified glaucoma type, unspecified laterality  Assessment & Plan:  Active  Dx'd by optometrist 1 month ago.  Referral to ophthalmology submitted today.  Advised patient to check with her network to find a provider covered under her insurance.   Monitor     Orders:  -     Ophthalmology Clinic    Type 2 diabetes mellitus with other specified complication, without long-term current use of insulin (CMS-HCC)  Assessment & Plan:  Lab Results   Component Value Date    A1C 6.3 (H) 07/10/2017    A1C 5.2 12/18/2016   Clinically stable  Consider restarting metformin depending on lab results.  Monitor FBG levels  regularly at home   Advise moderate weight loss (7% body weight) and regular physical activity (150 min/week) can reduce the risk of diabetes complications and improve glycemic control.     Reduce intake of trans fat/saturated fat (lowers LDL cholesterol and increases HDL cholesterol)   Labs as ordered  RTC precautions advised  Continue to monitor     Orders:  -     CBC w/ Diff Lavender  -     Comprehensive Metabolic Panel Green  -     Lipid Panel Green Plasma Separator Tube  -     Vitamin B12, Blood Green Plasma Separator Tube  -     Glycosylated Hgb(A1C), Blood Lavender  -     C-Reactive Protein, Blood Green Plasma Separator Tube    Need for vaccination  Comments:  Pneumovax 23 and shingrix dose B administered to patient in clinic. Patient tolerated well.  Orders:  -     Pneumococcal (PNEUMOVAX-23)  -     Shingles [facility])        ICD-10-CM ICD-9-CM    1. Need for vaccination  Z23 V05.9 Pneumococcal (PNEUMOVAX-23)      Shingles (SHINGRIX)   2. Glaucoma, unspecified glaucoma type, unspecified laterality  H40.9 365.9 Ophthalmology Clinic   3. Type 2  diabetes mellitus with other specified complication, without long-term current use of insulin (CMS-HCC)  E11.69 250.80 CBC w/ Diff Lavender      Comprehensive Metabolic Panel Green      Lipid Panel Green Plasma Separator Tube      Vitamin B12, Blood Green Plasma Separator Tube      Glycosylated Hgb(A1C), Blood Lavender      C-Reactive Protein, Blood Green Plasma Separator Tube   4. Need for vaccination  Z23 V05.9 Pneumococcal (PNEUMOVAX-23)      Shingles (SHINGRIX)    Pneumovax 23 and shingrix dose B administered to patient in clinic. Patient tolerated well.           By signing below, I acknowledge that I have reviewed the above note, dictated by me and scribed by Beulah Gandy, for accuracy and edited where necessary. The note accurately reflects the services provided at this  encounter. We retain the right to modify this information in the event of errors. Occasional  errors in punctuation, grammar and content may occur.      Electronically reviewed and signed by Alisia Ferrari, D.O.    [facility]  [email or web address]

## 2019-01-13 NOTE — Assessment & Plan Note (Addendum)
Active  Dx'd by optometrist 1 month ago.  Referral to ophthalmology submitted today.  Advised patient to check with her network to find a provider covered under her insurance.   Monitor

## 2019-01-13 NOTE — Patient Instructions (Signed)
Recommend testing blood sugar 3 times a week, at random times of the day

## 2019-01-29 ENCOUNTER — Encounter (INDEPENDENT_AMBULATORY_CARE_PROVIDER_SITE_OTHER): Payer: Self-pay | Admitting: Physician Assistant

## 2019-01-30 ENCOUNTER — Encounter: Payer: Self-pay | Admitting: Hospital

## 2019-02-05 ENCOUNTER — Encounter (INDEPENDENT_AMBULATORY_CARE_PROVIDER_SITE_OTHER): Payer: Self-pay | Admitting: Family Medicine

## 2019-02-06 MED ORDER — LANCETS MISC
1.0000 | Freq: Every day | 5 refills | Status: AC
Start: 2019-02-06 — End: ?

## 2019-02-06 NOTE — Telephone Encounter (Addendum)
Lancets refilled. Pt notified via MyChart msg.

## 2019-03-08 ENCOUNTER — Encounter (INDEPENDENT_AMBULATORY_CARE_PROVIDER_SITE_OTHER): Payer: Self-pay | Admitting: Family Medicine

## 2019-03-08 LAB — COMPREHENSIVE METABOLIC PANEL, BLOOD
ALT (SGPT): 28 U/L (ref 6–29)
AST (SGOT): 26 U/L (ref 10–35)
Albumin/Glob Ratio: 1.4 (calc) (ref 1.0–2.5)
Albumin: 4.2 g/dL (ref 3.6–5.1)
Alkaline Phos: 73 U/L (ref 37–153)
BUN: 14 mg/dL (ref 7–25)
Bilirubin, Total: 0.6 mg/dL (ref 0.2–1.2)
Calcium: 9.8 mg/dL (ref 8.6–10.4)
Carbon Dioxide: 29 mmol/L (ref 20–32)
Chloride: 104 mmol/L (ref 98–110)
Creatinine: 0.77 mg/dL (ref 0.50–1.05)
Globulin: 2.9 g/dL (calc) (ref 1.9–3.7)
Glucose: 122 mg/dL — ABNORMAL HIGH (ref 65–99)
Potassium: 3.9 mmol/L (ref 3.5–5.3)
Sodium: 143 mmol/L (ref 135–146)
Total Protein: 7.1 g/dL (ref 6.1–8.1)
eGFR African American: 99 mL/min/{1.73_m2} (ref >=60)
eGFR non-Afr.American: 86 mL/min/{1.73_m2} (ref >=60)

## 2019-03-08 LAB — LIPID(CHOL FRACT) PANEL, BLOOD
Chol/HDLC Ratio: 3.4 (calc) (ref <5.0)
Cholesterol: 140 mg/dL (ref <200)
HDL Cholesterol: 41 mg/dL — ABNORMAL LOW (ref >=50)
LDL-Cholesterol: 84 mg/dL (calc)
Non-HDL Cholesterol: 99 mg/dL (calc) (ref <130)
Triglycerides: 69 mg/dL (ref <150)

## 2019-03-08 LAB — CBC WITH DIFF, BLOOD
Abs Basophils: 26 cells/uL (ref 0–200)
Abs Eosinophils: 141 cells/uL (ref 15–500)
Abs Lymphs: 1918 cells/uL (ref 850–3900)
Abs Monocytes: 537 cells/uL (ref 200–950)
Abs NRBC: 0 cells/uL
Abs Neutrophils: 6178 cells/uL (ref 1500–7800)
Basophils: 0.3 %
Eosinophils: 1.6 %
HCT: 41.8 % (ref 35.0–45.0)
HGB: 14.3 g/dL (ref 11.7–15.5)
Lymps: 21.8 %
MCH: 28.8 pg (ref 27.0–33.0)
MCHC: 34.2 g/dL (ref 32.0–36.0)
MCV: 84.3 fL (ref 80.0–100.0)
MPV: 11.9 fL (ref 7.5–12.5)
Monocytes: 6.1 %
PLT: 237 10*3/uL (ref 140–400)
RBC: 4.96 10*6/uL (ref 3.80–5.10)
RDW: 13 % (ref 11.0–15.0)
SEGS: 70.2 %
WBC: 8.8 10*3/uL (ref 3.8–10.8)

## 2019-03-08 LAB — VITAMIN B12, BLOOD: Vitamin B12: 1018 pg/mL (ref 200–1100)

## 2019-03-08 LAB — GLYCOSYLATED HGB(A1C), BLOOD: Hgb A1C: 7.6 % of total Hgb — ABNORMAL HIGH (ref <5.7)

## 2019-03-08 LAB — C-REACTIVE PROTEIN, BLOOD: CRP: 15.4 mg/L — ABNORMAL HIGH (ref <8.0)

## 2019-03-11 ENCOUNTER — Telehealth (INDEPENDENT_AMBULATORY_CARE_PROVIDER_SITE_OTHER): Payer: Self-pay | Admitting: Family Medicine

## 2019-03-11 ENCOUNTER — Encounter (INDEPENDENT_AMBULATORY_CARE_PROVIDER_SITE_OTHER): Payer: Self-pay | Admitting: Hospital

## 2019-03-11 MED ORDER — METFORMIN HCL 500 MG OR TB24
500.0000 mg | ORAL_TABLET | Freq: Every day | ORAL | 1 refills | Status: DC
Start: 2019-03-11 — End: 2019-03-17

## 2019-03-11 NOTE — Progress Notes (Signed)
 Report A1c is almost to goal. Recommend improving lifestyle rather than increasing medication to get a1c to 7 or lower.  Repeat A1c in 3 months, follow up soon after lab    In addition, her inflammation marker was elevated. Recommend repeat crp and add esr in 4 weeks to determine if this is temporary or chronic.

## 2019-03-17 ENCOUNTER — Encounter (INDEPENDENT_AMBULATORY_CARE_PROVIDER_SITE_OTHER): Payer: Self-pay

## 2019-03-17 ENCOUNTER — Encounter (INDEPENDENT_AMBULATORY_CARE_PROVIDER_SITE_OTHER): Payer: Self-pay | Admitting: Family Medicine

## 2019-03-17 ENCOUNTER — Ambulatory Visit (INDEPENDENT_AMBULATORY_CARE_PROVIDER_SITE_OTHER): Admitting: Family Medicine

## 2019-03-17 VITALS — BP 126/78 | HR 76 | Temp 97.8°F | Resp 16 | Ht 62.0 in | Wt 249.0 lb

## 2019-03-17 MED ORDER — METFORMIN HCL 500 MG OR TB24
500.0000 mg | ORAL_TABLET | Freq: Every day | ORAL | 1 refills | Status: DC
Start: 2019-03-17 — End: 2020-03-05

## 2019-03-17 MED ORDER — LISINOPRIL-HYDROCHLOROTHIAZIDE 20-25 MG OR TABS
1.0000 | ORAL_TABLET | Freq: Every day | ORAL | 2 refills | Status: DC
Start: 2019-03-17 — End: 2020-01-22

## 2019-03-17 MED ORDER — ESOMEPRAZOLE MAGNESIUM 20 MG OR CPDR: 20.0000 mg | DELAYED_RELEASE_CAPSULE | Freq: Every day | ORAL | Status: AC

## 2019-03-28 ENCOUNTER — Encounter (INDEPENDENT_AMBULATORY_CARE_PROVIDER_SITE_OTHER): Payer: Self-pay | Admitting: Hospital

## 2019-04-02 ENCOUNTER — Encounter (INDEPENDENT_AMBULATORY_CARE_PROVIDER_SITE_OTHER): Payer: Self-pay

## 2019-04-02 ENCOUNTER — Encounter (INDEPENDENT_AMBULATORY_CARE_PROVIDER_SITE_OTHER): Payer: Self-pay | Admitting: Hospital

## 2019-04-04 ENCOUNTER — Telehealth (INDEPENDENT_AMBULATORY_CARE_PROVIDER_SITE_OTHER): Payer: Self-pay | Admitting: Family Medicine

## 2019-04-09 ENCOUNTER — Other Ambulatory Visit (INDEPENDENT_AMBULATORY_CARE_PROVIDER_SITE_OTHER): Payer: Self-pay | Admitting: Physician Assistant

## 2019-04-17 ENCOUNTER — Telehealth (INDEPENDENT_AMBULATORY_CARE_PROVIDER_SITE_OTHER): Payer: Self-pay | Admitting: Family Medicine

## 2019-04-17 ENCOUNTER — Encounter (INDEPENDENT_AMBULATORY_CARE_PROVIDER_SITE_OTHER): Payer: Self-pay | Admitting: Family Medicine

## 2019-04-17 NOTE — Telephone Encounter (Addendum)
 MyChart msg sent to pt.  Refer to telephone encounter (04/17/2019).

## 2019-04-18 ENCOUNTER — Telehealth (INDEPENDENT_AMBULATORY_CARE_PROVIDER_SITE_OTHER): Payer: Self-pay | Admitting: Family Medicine

## 2019-04-18 NOTE — Telephone Encounter (Signed)
 Lab orders have been electronically sent to [facility] for Thyroid Peroxidase, and Selenium. Pt states she wanted to get CRP and ESR lab from 03/11/2019 completed at [facility] today expect for A1C per pt request. All labs from 03/11/2019 and 04/17/2019 expect A1C have been faxed to [facility] so there are no issues and pt is able to complete all labs today.        Labs have been faxed to [facility].  Fax # [phone]

## 2019-04-21 LAB — VITAMIN D, 25-OH TOTAL: Vitamin D, 25-OH, Total: 61 ng/mL (ref 30–100)

## 2019-04-21 LAB — TRIIODOTHYRONINE, FREE, BLOOD: Free T3: 2.9 pg/mL (ref 2.3–4.2)

## 2019-04-21 LAB — ANTI-THYROID PEROXIDASE AB, BLOOD: Thyroid Peroxidase Ab: <1 IU/mL (ref <9)

## 2019-04-21 LAB — IODINE, SERUM/PLASMA -QUEST: Iodine, Serum/Plasma - Quest: 63 mcg/L (ref 52–109)

## 2019-04-21 LAB — C-REACTIVE PROTEIN, BLOOD: CRP: 11.8 mg/L — ABNORMAL HIGH (ref <8.0)

## 2019-04-21 LAB — TSH, BLOOD: TSH: 1.24 mIU/L (ref 0.40–4.50)

## 2019-04-21 LAB — VITAMIN B12, BLOOD: Vitamin B12: 683 pg/mL (ref 200–1100)

## 2019-04-22 ENCOUNTER — Encounter (INDEPENDENT_AMBULATORY_CARE_PROVIDER_SITE_OTHER): Payer: Self-pay | Admitting: Hospital

## 2019-04-22 ENCOUNTER — Encounter (INDEPENDENT_AMBULATORY_CARE_PROVIDER_SITE_OTHER): Payer: Self-pay | Admitting: Family Medicine

## 2019-04-26 LAB — CPK-CREATINE PHOSPHOKINASE, BLOOD: Creatine Kinase, Total: 65 U/L (ref 29–143)

## 2019-04-26 LAB — RF (RHEUMATOID FACTOR), BLOOD: Rheumatoid Factor: <14 IU/mL (ref <14)

## 2019-04-26 LAB — ANA, IFA W/REFL TO TITER/ PATTERN/LUPUS/SLE ABS -QUEST: ANA Screen, IFA: NEGATIVE

## 2019-04-26 LAB — URIC ACID, BLOOD: Uric Acid: 4.7 mg/dL (ref 2.5–7.0)

## 2019-04-28 ENCOUNTER — Telehealth (INDEPENDENT_AMBULATORY_CARE_PROVIDER_SITE_OTHER): Payer: Self-pay | Admitting: Family Medicine

## 2019-04-28 NOTE — Telephone Encounter (Signed)
 Report follow up labs for inflammation were all within normal limit(s)     Cause of inflammation still uncertain  Continue to monitor for infection etc     With recent abdominal symptom(s) there is some concern for inflammation in the abdomen. Last Colonoscopy?    Repeat crp 4 weeks

## 2019-04-29 ENCOUNTER — Encounter (INDEPENDENT_AMBULATORY_CARE_PROVIDER_SITE_OTHER): Payer: Self-pay | Admitting: Family Medicine

## 2019-04-30 ENCOUNTER — Encounter (INDEPENDENT_AMBULATORY_CARE_PROVIDER_SITE_OTHER): Payer: Self-pay | Admitting: Hospital

## 2019-04-30 NOTE — Telephone Encounter (Signed)
 mychart message sent to pt about her recent f/u labs per Dr Rome's notes below.

## 2019-04-30 NOTE — Telephone Encounter (Signed)
Please read pt's message. Thank you!

## 2019-05-11 ENCOUNTER — Other Ambulatory Visit (INDEPENDENT_AMBULATORY_CARE_PROVIDER_SITE_OTHER): Payer: Self-pay | Admitting: Physician Assistant

## 2019-05-12 ENCOUNTER — Encounter (INDEPENDENT_AMBULATORY_CARE_PROVIDER_SITE_OTHER): Payer: Self-pay | Admitting: Family Medicine

## 2019-05-12 MED ORDER — CONTOUR NEXT TEST VI STRP
ORAL_STRIP | 2 refills | Status: AC
Start: 2019-05-12 — End: ?

## 2019-05-12 NOTE — Telephone Encounter (Addendum)
 MyChart msg sent.  Medication refilled in Refill encounter (05/11/2019).

## 2019-05-19 ENCOUNTER — Encounter (INDEPENDENT_AMBULATORY_CARE_PROVIDER_SITE_OTHER): Payer: Self-pay | Admitting: Physician Assistant

## 2019-06-06 ENCOUNTER — Encounter (INDEPENDENT_AMBULATORY_CARE_PROVIDER_SITE_OTHER): Payer: Self-pay

## 2019-09-08 ENCOUNTER — Encounter (INDEPENDENT_AMBULATORY_CARE_PROVIDER_SITE_OTHER): Payer: Self-pay

## 2019-09-08 ENCOUNTER — Telehealth (INDEPENDENT_AMBULATORY_CARE_PROVIDER_SITE_OTHER): Payer: Self-pay | Admitting: Family Medicine

## 2020-01-22 ENCOUNTER — Encounter (INDEPENDENT_AMBULATORY_CARE_PROVIDER_SITE_OTHER): Payer: Self-pay | Admitting: Family Medicine

## 2020-01-22 DIAGNOSIS — I10 Essential (primary) hypertension: Secondary | ICD-10-CM

## 2020-01-22 NOTE — Telephone Encounter (Addendum)
 rx proposed  Last rf and lov htn 03/17/2019  No f/u scheduled yet

## 2020-01-23 MED ORDER — LISINOPRIL-HYDROCHLOROTHIAZIDE 20-25 MG OR TABS
1.0000 | ORAL_TABLET | Freq: Every day | ORAL | 0 refills | Status: DC
Start: 2020-01-23 — End: 2020-01-27

## 2020-01-23 NOTE — Telephone Encounter (Signed)
 Prescription sent to [facility] pharmacy.   Approve prescription to [place] pharmacy as needed

## 2020-01-27 MED ORDER — LISINOPRIL-HYDROCHLOROTHIAZIDE 20-25 MG OR TABS
1.0000 | ORAL_TABLET | Freq: Every day | ORAL | 0 refills | Status: AC
Start: 2020-01-27 — End: ?

## 2020-01-27 NOTE — Telephone Encounter (Addendum)
 rx resent to preferred pharmacy    Message sent to provider as [facility]

## 2020-01-27 NOTE — Addendum Note (Signed)
 Addended by: Nelli Swalley on: 01/27/2020 03:06 PM     Modules accepted: Orders

## 2020-03-05 ENCOUNTER — Other Ambulatory Visit (INDEPENDENT_AMBULATORY_CARE_PROVIDER_SITE_OTHER): Payer: Self-pay | Admitting: Family Medicine

## 2020-03-05 DIAGNOSIS — E1169 Type 2 diabetes mellitus with other specified complication: Secondary | ICD-10-CM

## 2020-03-05 MED ORDER — METFORMIN HCL 500 MG OR TB24
ORAL_TABLET | ORAL | 1 refills | Status: AC
Start: 2020-03-05 — End: ?

## 2020-06-16 LAB — HM PAP SMEAR: HM Pap smear: NORMAL

## 2020-06-28 ENCOUNTER — Telehealth (INDEPENDENT_AMBULATORY_CARE_PROVIDER_SITE_OTHER): Payer: Self-pay | Admitting: Family Medicine

## 2020-06-28 NOTE — Telephone Encounter (Signed)
 Pt called in regards to her medical records. She states she has been calling and following up and has still not had her medical records sent to her new pcps office. [place] and [place] physically come in to pick up. She just needs medical records from 05/16/2017 and after. Please advise and send to fax # [phone] ATNN: Dr. Connye Burkitt.    I did reach out to records dept and was unable to get a hold of them due to they leave office at 3:30 PM    Pt has a new phone number; Ritta can be reached at:  [phone]. Please f/u with pt.

## 2020-07-08 ENCOUNTER — Telehealth: Payer: Self-pay | Admitting: *Deleted

## 2020-07-08 NOTE — Telephone Encounter (Signed)
Late entry----received records and referral from Dr Connye Burkitt office on 6/15; since then both her and our office have tried to receive records from [place] on the patient's past surgery.   Spoke with the patient yesterday and she stated "I was told in [place] I need two clean pap smears and five years of following by an Ob-Gyn. Not sure why I need the pap smears since I had everything taken out. Why can't I just see Dr Connye Burkitt for this and have her follow me?" Received records late yesterday afternoon and patient built into Epic.   Called the patient and explained that our office had received the record and the doctor will review them; she will receive a soon.

## 2020-07-09 ENCOUNTER — Telehealth: Payer: Self-pay | Admitting: *Deleted

## 2020-07-09 NOTE — Telephone Encounter (Signed)
Patient returned call and stated "I don't understand why I should see a gyn doctor and gyn onc doctor when they both will do the same thing." Explained to the patient that most gyn cancers are followed by both doctors rotating every six months. Patient stated "I would rather just stay with Dr Connye Burkitt." Explained that our office will forwarded the message to Dr Connye Burkitt.  Spoke with Myrene at Dr Connye Burkitt office and explained the patient' request.

## 2020-07-09 NOTE — Telephone Encounter (Signed)
Called and left a message for the patient to call the office back.

## 2020-07-15 NOTE — Telephone Encounter (Signed)
 Reached out to medical records to have this handled.

## 2020-10-05 LAB — HM DIABETES EYE EXAM

## 2020-10-14 ENCOUNTER — Other Ambulatory Visit: Payer: Self-pay | Admitting: Family Medicine

## 2020-10-14 DIAGNOSIS — Z1231 Encounter for screening mammogram for malignant neoplasm of breast: Secondary | ICD-10-CM

## 2020-10-18 ENCOUNTER — Other Ambulatory Visit: Payer: Self-pay

## 2020-10-18 ENCOUNTER — Ambulatory Visit
Admission: RE | Admit: 2020-10-18 | Discharge: 2020-10-18 | Disposition: A | Discharge status: Home / Self Care | Payer: BC Managed Care – PPO | Source: Ambulatory Visit | Attending: Family Medicine | Admitting: Family Medicine

## 2020-10-18 DIAGNOSIS — Z1231 Encounter for screening mammogram for malignant neoplasm of breast: Secondary | ICD-10-CM

## 2021-01-19 ENCOUNTER — Ambulatory Visit (INDEPENDENT_AMBULATORY_CARE_PROVIDER_SITE_OTHER): Payer: BC Managed Care – PPO | Admitting: Physician Assistant

## 2021-01-19 ENCOUNTER — Encounter: Payer: Self-pay | Admitting: Physician Assistant

## 2021-01-19 ENCOUNTER — Other Ambulatory Visit: Payer: Self-pay

## 2021-01-19 VITALS — BP 123/76 | HR 76 | Temp 97.7°F | Ht 62.0 in | Wt 256.0 lb

## 2021-01-19 DIAGNOSIS — E1159 Type 2 diabetes mellitus with other circulatory complications: Secondary | ICD-10-CM

## 2021-01-19 DIAGNOSIS — E785 Hyperlipidemia, unspecified: Secondary | ICD-10-CM

## 2021-01-19 DIAGNOSIS — E1169 Type 2 diabetes mellitus with other specified complication: Secondary | ICD-10-CM

## 2021-01-19 DIAGNOSIS — Z1321 Encounter for screening for nutritional disorder: Secondary | ICD-10-CM | POA: Diagnosis not present

## 2021-01-19 DIAGNOSIS — Z13228 Encounter for screening for other metabolic disorders: Secondary | ICD-10-CM

## 2021-01-19 DIAGNOSIS — Z7689 Persons encountering health services in other specified circumstances: Secondary | ICD-10-CM

## 2021-01-19 DIAGNOSIS — Z1329 Encounter for screening for other suspected endocrine disorder: Secondary | ICD-10-CM | POA: Diagnosis not present

## 2021-01-19 DIAGNOSIS — I10 Essential (primary) hypertension: Secondary | ICD-10-CM | POA: Insufficient documentation

## 2021-01-19 DIAGNOSIS — Z8542 Personal history of malignant neoplasm of other parts of uterus: Secondary | ICD-10-CM | POA: Diagnosis not present

## 2021-01-19 DIAGNOSIS — Z130 Encounter for screening for diseases of the blood and blood-forming organs and certain disorders involving the immune mechanism: Secondary | ICD-10-CM

## 2021-01-19 DIAGNOSIS — I152 Hypertension secondary to endocrine disorders: Secondary | ICD-10-CM

## 2021-01-19 DIAGNOSIS — H401131 Primary open-angle glaucoma, bilateral, mild stage: Secondary | ICD-10-CM

## 2021-01-19 LAB — POCT GLYCOSYLATED HEMOGLOBIN (HGB A1C): Hemoglobin A1C: 10.6 % — AB (ref 4.0–5.6)

## 2021-01-19 MED ORDER — LISINOPRIL-HYDROCHLOROTHIAZIDE 20-25 MG PO TABS: 1.0000 | ORAL_TABLET | Freq: Every day | ORAL | 0 refills | Status: DC

## 2021-01-19 MED ORDER — PRAVASTATIN SODIUM 10 MG PO TABS: 10.0000 mg | ORAL_TABLET | Freq: Every day | ORAL | 0 refills | Status: DC

## 2021-01-19 MED ORDER — RYBELSUS 7 MG PO TABS: 1.0000 | ORAL_TABLET | Freq: Every day | ORAL | 0 refills | Status: DC

## 2021-01-19 MED ORDER — METFORMIN HCL ER 500 MG PO TB24: 500.0000 mg | ORAL_TABLET | Freq: Every day | ORAL | 0 refills | Status: DC

## 2021-01-19 MED ORDER — RYBELSUS 3 MG PO TABS: 3.0000 mg | ORAL_TABLET | Freq: Every day | ORAL | 0 refills | Status: DC

## 2021-01-19 MED ORDER — ONETOUCH VERIO VI STRP: 1.0000 | ORAL_STRIP | Freq: Every day | 2 refills | Status: DC

## 2021-01-19 NOTE — Assessment & Plan Note (Signed)
-   BP stable. -Continue current medication regimen. -Will collect CMP for medication monitoring. -Will continue to monitor. 

## 2021-01-19 NOTE — Patient Instructions (Signed)

## 2021-01-19 NOTE — Progress Notes (Signed)
New Patient Office Visit  Subjective:  Patient ID: Virginia Weeks, female    DOB: 04/25/1960  Age: 61 y.o. MRN: [id number]  CC:  Chief Complaint  Patient presents with   New Patient (Initial Visit)    HPI Virginia Weeks presents to establish care. Patient has past medical history of type 2 diabetes mellitus, hypertension, hyperlipidemia, glaucoma and endometrial carcinoma.  Patient reports she was treated for endometrial carcinoma in [place] and had a complete hysterectomy 10/08/2017.  Relocated to [facility] July 2021 and has seen an OB/GYN.  Patient reports last saw her PCP 02/2020 and metformin XR was increased to 1000 mg daily due to elevated A1c.  Reports had a follow-up visit on October which she had to cancel because of a family event which was out of town.  Reports since the increase of metformin has noticed her sugars being higher than before, running in the 200s-300s.  Denies dietary changes with carbohydrate and sugar intake.  Reports has not been on other diabetes medications.  Takes lisinopril-HCTZ 20-25 MG for high blood pressure and pravastatin 10 mg for high cholesterol.    Past Medical History:  Diagnosis Date   Diabetes mellitus without complication ([place])    Glaucoma    Hypertension     Past Surgical History:  Procedure Laterality Date   ABDOMINAL HYSTERECTOMY  10/08/2017   BREAST BIOPSY Left    CESAREAN SECTION  06/18/1980   09/29/1987    Family History  Problem Relation Age of Onset   Stroke Mother    Hypertension Mother    Hyperlipidemia Mother    Alcohol abuse Father    Cancer Maternal Grandmother    Breast cancer Maternal Grandmother     Social History   Socioeconomic History   Marital status: Married    Spouse name: Virginia Weeks   Number of children: 2   Years of education: Not on file   Highest education level: Not on file  Occupational History   Not on file  Tobacco Use   Smoking status: Former    Types: Cigarettes    Quit date:  2008    Years since quitting: 15.0   Smokeless tobacco: Never  Vaping Use   Vaping Use: Not on file  Substance and Sexual Activity   Alcohol use: Yes   Drug use: Yes   Sexual activity: Yes    Birth control/protection: None  Other Topics Concern   Not on file  Social History Narrative   Not on file   Social Determinants of Health   Financial Resource Strain: Not on file  Food Insecurity: Not on file  Transportation Needs: Not on file  Physical Activity: Not on file  Stress: Not on file  Social Connections: Not on file  Intimate Partner Violence: Not on file    ROS Review of Systems Review of Systems:  A fourteen system review of systems was performed and found to be positive as per HPI. Objective:   Today's Vitals: BP 123/76    Pulse 76    Temp 97.7 F (36.5 C)    Ht 5\' 2"  (1.575 m)    Wt 256 lb (116.1 kg)    SpO2 97%    BMI 46.82 kg/m   Physical Exam General:  Well Developed, well nourished, appropriate for stated age.  Neuro:  Alert and oriented,  extra-ocular muscles intact  HEENT:  Normocephalic, atraumatic, neck supple Skin:  no gross rash, warm, pink. Cardiac:  RRR, S1 S2 Respiratory:  CTA B/L  Vascular:  Ext warm, no cyanosis apprec.; cap RF less 2 sec. Psych:  No HI/SI, judgement and insight good, Euthymic mood. Full Affect.  Assessment & Plan:   Problem List Items Addressed This Visit       Cardiovascular and Mediastinum   Hypertension associated with diabetes ([place])    - BP stable. -Continue current medication regimen. -Will collect CMP for medication monitoring. -Will continue to monitor.      Relevant Medications   metFORMIN (GLUCOPHAGE-XR) 500 MG 24 hr tablet   Semaglutide (RYBELSUS) 7 MG TABS   lisinopril-hydrochlorothiazide (ZESTORETIC) 20-25 MG tablet   pravastatin (PRAVACHOL) 10 MG tablet   Other Relevant Orders   Comprehensive metabolic panel     Endocrine   Type 2 diabetes mellitus with other specified complication ([place])    - Per  patient A1c was 9.1 03/10/2020, A1c today 10.6.  Discussed with patient treatment adjustments and declined starting any kind of injection therapy, prefers oral medication.  No history of MEN or MTC.  Will start GLP-1 therapy (Rybelsus).  Discussed with patient potential side effects including but not limited to pancreatitis, diarrhea, constipation or nausea.  Advised to let me know if unable to tolerate medication.  Will provide sample of Rybelsus 3 mg for 30 days and then increase to 7 mg.  Will reduce metformin to 500 mg daily, question paradoxical reaction with increased dose. Continue ambulatory glucose monitoring.  Will reassess A1c and medication therapy in 3 months.      Relevant Medications   metFORMIN (GLUCOPHAGE-XR) 500 MG 24 hr tablet   Semaglutide (RYBELSUS) 7 MG TABS   lisinopril-hydrochlorothiazide (ZESTORETIC) 20-25 MG tablet   pravastatin (PRAVACHOL) 10 MG tablet   ONETOUCH VERIO test strip   Other Relevant Orders   POCT glycosylated hemoglobin (Hb A1C) (Completed)   Comprehensive metabolic panel   CBC with Differential/Platelet   Hyperlipidemia associated with type 2 diabetes mellitus ([place])    - Will collect lipid panel and hepatic function.  Per patient last LDL 107 (03/10/2020).  Discussed with patient LDL goal of <70, if LDL remains above goal recommend medication adjustments by increasing pravastatin to 20 mg. Will continue to monitor.      Relevant Medications   metFORMIN (GLUCOPHAGE-XR) 500 MG 24 hr tablet   Semaglutide (RYBELSUS) 7 MG TABS   lisinopril-hydrochlorothiazide (ZESTORETIC) 20-25 MG tablet   pravastatin (PRAVACHOL) 10 MG tablet   Other Relevant Orders   Lipid panel     Other   Primary open-angle glaucoma, bilateral, mild stage   Relevant Medications   latanoprost (XALATAN) 0.005 % ophthalmic solution   Personal history of malignant neoplasm of other parts of uterus    -S/p hysterectomy. Will request records.      Other Visit Diagnoses      Encounter to establish care    -  Primary   Screening for endocrine, nutritional, metabolic and immunity disorder       Relevant Orders   POCT glycosylated hemoglobin (Hb A1C) (Completed)   Comprehensive metabolic panel   CBC with Differential/Platelet   Lipid panel   TSH       Encounter to establish care: -Will request PCP records and review once obtained. -Will request OB/GYN records. -Will update health maintenance once records obtained. UTD  mammogram 10/18/2020, normal. -Follow up in 3 months.   Outpatient Encounter Medications as of 01/19/2021  Medication Sig   metFORMIN (GLUCOPHAGE-XR) 500 MG 24 hr tablet Take 1 tablet (500 mg total) by mouth daily with breakfast.  Semaglutide (RYBELSUS) 7 MG TABS Take 1 tablet by mouth daily.   latanoprost (XALATAN) 0.005 % ophthalmic solution SMARTSIG:1 Drop(s) In Eye(s) Every Evening   lisinopril-hydrochlorothiazide (ZESTORETIC) 20-25 MG tablet Take 1 tablet by mouth daily.   ONETOUCH VERIO test strip 1 each by Other route daily.   pravastatin (PRAVACHOL) 10 MG tablet Take 1 tablet (10 mg total) by mouth daily.   [DISCONTINUED] lisinopril-hydrochlorothiazide (ZESTORETIC) 20-25 MG tablet Take 1 tablet by mouth daily.   [DISCONTINUED] metFORMIN (GLUCOPHAGE-XR) 500 MG 24 hr tablet SMARTSIG:2 Tablet(s) By Mouth Every Evening   [DISCONTINUED] ONETOUCH VERIO test strip 1 each daily.   [DISCONTINUED] pravastatin (PRAVACHOL) 10 MG tablet Take 10 mg by mouth daily.   No facility-administered encounter medications on file as of 01/19/2021.    Follow-up: Return in about 3 months (around 04/19/2021) for DM, HTN, HLD.   Note:  This note was prepared with assistance of Dragon voice recognition software. Occasional wrong-word or sound-a-like substitutions may have occurred due to the inherent limitations of voice recognition software.  Lorrene Reid, PA-C

## 2021-01-19 NOTE — Assessment & Plan Note (Signed)
-   Per patient A1c was 9.1 03/10/2020, A1c today 10.6.  Discussed with patient treatment adjustments and declined starting any kind of injection therapy, prefers oral medication.  No history of MEN or MTC.  Will start GLP-1 therapy (Rybelsus).  Discussed with patient potential side effects including but not limited to pancreatitis, diarrhea, constipation or nausea.  Advised to let me know if unable to tolerate medication.  Will provide sample of Rybelsus 3 mg for 30 days and then increase to 7 mg.  Will reduce metformin to 500 mg daily, question paradoxical reaction with increased dose. Continue ambulatory glucose monitoring.  Will reassess A1c and medication therapy in 3 months.

## 2021-01-19 NOTE — Assessment & Plan Note (Signed)
-   Will collect lipid panel and hepatic function.  Per patient last LDL 107 (03/10/2020).  Discussed with patient LDL goal of <70, if LDL remains above goal recommend medication adjustments by increasing pravastatin to 20 mg. Will continue to monitor.

## 2021-01-19 NOTE — Assessment & Plan Note (Signed)
-  S/p hysterectomy. Will request records.

## 2021-01-20 LAB — LIPID PANEL
Chol/HDL Ratio: 2.9 ratio (ref 0.0–4.4)
Cholesterol, Total: 138 mg/dL (ref 100–199)
HDL: 47 mg/dL (ref >39)
LDL Chol Calc (NIH): 76 mg/dL (ref 0–99)
Triglycerides: 75 mg/dL (ref 0–149)
VLDL Cholesterol Cal: 15 mg/dL (ref 5–40)

## 2021-01-20 LAB — CBC WITH DIFFERENTIAL/PLATELET
Basophils Absolute: 0 10*3/uL (ref 0.0–0.2)
Basos: 0 %
EOS (ABSOLUTE): 0.2 10*3/uL (ref 0.0–0.4)
Eos: 2 %
Hematocrit: 43.2 % (ref 34.0–46.6)
Hemoglobin: 14.4 g/dL (ref 11.1–15.9)
Immature Grans (Abs): 0 10*3/uL (ref 0.0–0.1)
Immature Granulocytes: 1 %
Lymphocytes Absolute: 2.4 10*3/uL (ref 0.7–3.1)
Lymphs: 28 %
MCH: 28.5 pg (ref 26.6–33.0)
MCHC: 33.3 g/dL (ref 31.5–35.7)
MCV: 86 fL (ref 79–97)
Monocytes Absolute: 0.6 10*3/uL (ref 0.1–0.9)
Monocytes: 7 %
Neutrophils Absolute: 5.3 10*3/uL (ref 1.4–7.0)
Neutrophils: 62 %
Platelets: 233 10*3/uL (ref 150–450)
RBC: 5.05 x10E6/uL (ref 3.77–5.28)
RDW: 12.1 % (ref 11.7–15.4)
WBC: 8.5 10*3/uL (ref 3.4–10.8)

## 2021-01-20 LAB — COMPREHENSIVE METABOLIC PANEL
ALT: 92 IU/L — ABNORMAL HIGH (ref 0–32)
AST: 86 IU/L — ABNORMAL HIGH (ref 0–40)
Albumin/Globulin Ratio: 1.4 (ref 1.2–2.2)
Albumin: 4.1 g/dL (ref 3.8–4.9)
Alkaline Phosphatase: 87 IU/L (ref 44–121)
BUN/Creatinine Ratio: 18 (ref 9–23)
BUN: 12 mg/dL (ref 6–24)
Bilirubin Total: 0.6 mg/dL (ref 0.0–1.2)
CO2: 27 mmol/L (ref 20–29)
Calcium: 9.6 mg/dL (ref 8.7–10.2)
Chloride: 100 mmol/L (ref 96–106)
Creatinine, Ser: 0.65 mg/dL (ref 0.57–1.00)
Globulin, Total: 2.9 g/dL (ref 1.5–4.5)
Glucose: 260 mg/dL — ABNORMAL HIGH (ref 70–99)
Potassium: 4.3 mmol/L (ref 3.5–5.2)
Sodium: 140 mmol/L (ref 134–144)
Total Protein: 7 g/dL (ref 6.0–8.5)
eGFR: 101 mL/min/{1.73_m2} (ref >59)

## 2021-01-20 LAB — TSH: TSH: 0.681 u[IU]/mL (ref 0.450–4.500)

## 2021-01-21 ENCOUNTER — Other Ambulatory Visit: Payer: Self-pay

## 2021-01-21 ENCOUNTER — Telehealth: Payer: Self-pay | Admitting: Physician Assistant

## 2021-01-21 DIAGNOSIS — E1169 Type 2 diabetes mellitus with other specified complication: Secondary | ICD-10-CM

## 2021-01-21 MED ORDER — RYBELSUS 7 MG PO TABS: 1.0000 | ORAL_TABLET | Freq: Every day | ORAL | 0 refills | Status: DC

## 2021-01-21 NOTE — Telephone Encounter (Signed)
Called the patient to notify of refill being sent. Left voicemail letting patient know.

## 2021-01-21 NOTE — Telephone Encounter (Signed)
Patient called and stated when she called to have the Rybelsus 7 mg filled [facility] told her that the prescription was no longer active and a new prescription of it would need to be sent in. Can you please advise? [phone]

## 2021-02-18 ENCOUNTER — Telehealth: Payer: Self-pay | Admitting: Physician Assistant

## 2021-02-18 NOTE — Telephone Encounter (Signed)
Patient called asking if she can go from the 3 mg straight to the 7 mg of the Rybelsus? She states she only has one more of the 3 mg and she doesn't know what dose to do for Sunday. Please advise. [phone]

## 2021-02-18 NOTE — Telephone Encounter (Signed)
Called to tell patient it was ok for her to start the 7mg  after she completed the 3mg  of the Rybelsus. She gave verbal understanding.

## 2021-04-03 ENCOUNTER — Encounter: Payer: Self-pay | Admitting: Physician Assistant

## 2021-04-04 ENCOUNTER — Telehealth: Payer: Self-pay | Admitting: Physician Assistant

## 2021-04-04 ENCOUNTER — Other Ambulatory Visit: Payer: Self-pay | Admitting: Physician Assistant

## 2021-04-04 ENCOUNTER — Other Ambulatory Visit: Payer: Self-pay

## 2021-04-04 ENCOUNTER — Encounter: Payer: Self-pay | Admitting: Physician Assistant

## 2021-04-04 DIAGNOSIS — E1169 Type 2 diabetes mellitus with other specified complication: Secondary | ICD-10-CM

## 2021-04-04 MED ORDER — MICROLET LANCETS MISC: 11 refills | Status: AC

## 2021-04-04 MED ORDER — ONETOUCH DELICA LANCETS 33G MISC: 12 refills | Status: DC

## 2021-04-04 NOTE — Telephone Encounter (Signed)
Patient is requesting refill of Microlet lancets. Please advise. [phone] ?

## 2021-04-04 NOTE — Telephone Encounter (Signed)
Refill was sent in today. Have her contact her pharmacy.  ?

## 2021-04-05 ENCOUNTER — Encounter: Payer: Self-pay | Admitting: Physician Assistant

## 2021-04-15 ENCOUNTER — Other Ambulatory Visit: Payer: Self-pay | Admitting: Physician Assistant

## 2021-04-15 DIAGNOSIS — E1169 Type 2 diabetes mellitus with other specified complication: Secondary | ICD-10-CM

## 2021-04-19 ENCOUNTER — Other Ambulatory Visit: Payer: Self-pay | Admitting: Physician Assistant

## 2021-04-19 DIAGNOSIS — I152 Hypertension secondary to endocrine disorders: Secondary | ICD-10-CM

## 2021-04-20 ENCOUNTER — Ambulatory Visit: Payer: BC Managed Care – PPO | Admitting: Physician Assistant

## 2021-05-04 ENCOUNTER — Ambulatory Visit (INDEPENDENT_AMBULATORY_CARE_PROVIDER_SITE_OTHER): Payer: BC Managed Care – PPO | Admitting: Physician Assistant

## 2021-05-04 ENCOUNTER — Encounter: Payer: Self-pay | Admitting: Physician Assistant

## 2021-05-04 VITALS — BP 132/77 | HR 80 | Temp 98.0°F | Ht 62.0 in | Wt 256.0 lb

## 2021-05-04 DIAGNOSIS — E785 Hyperlipidemia, unspecified: Secondary | ICD-10-CM | POA: Diagnosis not present

## 2021-05-04 DIAGNOSIS — I152 Hypertension secondary to endocrine disorders: Secondary | ICD-10-CM

## 2021-05-04 DIAGNOSIS — E1159 Type 2 diabetes mellitus with other circulatory complications: Secondary | ICD-10-CM

## 2021-05-04 DIAGNOSIS — G8929 Other chronic pain: Secondary | ICD-10-CM

## 2021-05-04 DIAGNOSIS — M25562 Pain in left knee: Secondary | ICD-10-CM

## 2021-05-04 DIAGNOSIS — M25561 Pain in right knee: Secondary | ICD-10-CM | POA: Diagnosis not present

## 2021-05-04 DIAGNOSIS — E1169 Type 2 diabetes mellitus with other specified complication: Secondary | ICD-10-CM | POA: Diagnosis not present

## 2021-05-04 LAB — POCT GLYCOSYLATED HEMOGLOBIN (HGB A1C): Hemoglobin A1C: 11.1 % — AB (ref 4.0–5.6)

## 2021-05-04 LAB — POCT UA - MICROALBUMIN
Albumin/Creatinine Ratio, Urine, POC: <30
Creatinine, POC: 200 mg/dL
Microalbumin Ur, POC: 30 mg/L

## 2021-05-04 MED ORDER — MELOXICAM 7.5 MG PO TABS: 7.5000 mg | ORAL_TABLET | Freq: Every day | ORAL | 0 refills | Status: DC

## 2021-05-04 MED ORDER — PRAVASTATIN SODIUM 10 MG PO TABS: 10.0000 mg | ORAL_TABLET | Freq: Every day | ORAL | 1 refills | Status: DC

## 2021-05-04 MED ORDER — RYBELSUS 14 MG PO TABS: 14.0000 mg | ORAL_TABLET | Freq: Every day | ORAL | 1 refills | Status: DC

## 2021-05-04 MED ORDER — GLIPIZIDE 5 MG PO TABS
5.0000 mg | ORAL_TABLET | Freq: Every day | ORAL | 1 refills | Status: DC
Start: 2021-05-04 — End: 2021-08-03

## 2021-05-04 NOTE — Assessment & Plan Note (Addendum)
-  Uncontrolled, A1c increased from 10.6 to 11.1, will increase Rybelsus to 14 mg and start Glipizide 5 mg to take before her largest meal of the day. Discussed potential side effects with Glipizide so advised to let me know if unable to tolerate. Advised to give me an update in 4 weeks if her fasting sugars are still >200 for further medication adjustments. Monitor for severe symptoms such as altered mental status, extreme thirst, sweats or palpitations and should seek immediate medical care. Pt defers injectable therapy. Pt verbalized understanding. Discussed diabetic diet.  ?

## 2021-05-04 NOTE — Patient Instructions (Signed)

## 2021-05-04 NOTE — Assessment & Plan Note (Signed)
-  Stable. °-Continue current medication regimen. °-Will collect CMP to monitor renal function and electrolytes. °-Will continue to monitor. °

## 2021-05-04 NOTE — Assessment & Plan Note (Signed)
-  Will collect lipid panel and hepatic function. ?-Recommend to follow a low fat diet. ?-Continue current medication regimen. ?-Will continue to monitor. ?

## 2021-05-04 NOTE — Progress Notes (Signed)
?Established patient visit ? ? ?Patient: Virginia Weeks   DOB: 1960/01/24   61 y.o. Female  MRN: [id number] ?Visit Date: 05/04/2021 ? ?Chief Complaint  ?Patient presents with  ? Follow-up  ? Diabetes  ? Hyperlipidemia  ? Hypertension  ? ?Subjective  ?  ?HPI  ?Patient reports chronic knee pain which has gradually progressed. There are days where she cannot bend her knees, right greater than the left. In the past she had x-rays and MRI and reports was advised needed knee replacement but she deferred.  ? ?Diabetes mellitus: Pt denies increased urination or thirst. Pt reports medication compliance. Tolerating Rybelsus 7 mg. No hypoglycemic events. Checking glucose at home. FBS range from 223-306.  ? ?HTN: Pt denies chest pain, palpitations, dizziness or lower extremity swelling. Taking medication as directed without side effects. Checks BP at home and readings range in 120-125/70-80. Pt follows a low salt diet. ? ?HLD: Pt taking medication as directed without issues.  ? ? ?Medications: ?Outpatient Medications Prior to Visit  ?Medication Sig  ? latanoprost (XALATAN) 0.005 % ophthalmic solution SMARTSIG:1 Drop(s) In Eye(s) Every Evening  ? lisinopril-hydrochlorothiazide (ZESTORETIC) 20-25 MG tablet TAKE 1 TABLET BY MOUTH EVERY DAY  ? metFORMIN (GLUCOPHAGE-XR) 500 MG 24 hr tablet TAKE 1 TABLET BY MOUTH EVERY DAY WITH BREAKFAST  ? Microlet Lancets MISC Use to check FBS up to 2 times daily  ? ONETOUCH VERIO test strip 1 each by Other route daily.  ? [DISCONTINUED] pravastatin (PRAVACHOL) 10 MG tablet Take 1 tablet (10 mg total) by mouth daily.  ? [DISCONTINUED] Semaglutide (RYBELSUS) 7 MG TABS Take 1 tablet by mouth daily.  ? ?No facility-administered medications prior to visit.  ? ? ?Review of Systems ?Review of Systems:  ?A fourteen system review of systems was performed and found to be positive as per HPI. ? ? ?Last CBC ?Lab Results  ?Component Value Date  ? WBC 8.5 01/19/2021  ? HGB 14.4 01/19/2021  ? HCT 43.2  01/19/2021  ? MCV 86 01/19/2021  ? MCH 28.5 01/19/2021  ? RDW 12.1 01/19/2021  ? PLT 233 01/19/2021  ? ?Last metabolic panel ?Lab Results  ?Component Value Date  ? GLUCOSE 260 (H) 01/19/2021  ? NA 140 01/19/2021  ? K 4.3 01/19/2021  ? CL 100 01/19/2021  ? CO2 27 01/19/2021  ? BUN 12 01/19/2021  ? CREATININE 0.65 01/19/2021  ? EGFR 101 01/19/2021  ? CALCIUM 9.6 01/19/2021  ? PROT 7.0 01/19/2021  ? ALBUMIN 4.1 01/19/2021  ? LABGLOB 2.9 01/19/2021  ? AGRATIO 1.4 01/19/2021  ? BILITOT 0.6 01/19/2021  ? ALKPHOS 87 01/19/2021  ? AST 86 (H) 01/19/2021  ? ALT 92 (H) 01/19/2021  ? ?Last lipids ?Lab Results  ?Component Value Date  ? CHOL 138 01/19/2021  ? HDL 47 01/19/2021  ? [place] 76 01/19/2021  ? TRIG 75 01/19/2021  ? CHOLHDL 2.9 01/19/2021  ? ?Last hemoglobin A1c ?Lab Results  ?Component Value Date  ? HGBA1C 11.1 (A) 05/04/2021  ? ?Last thyroid functions ?Lab Results  ?Component Value Date  ? TSH 0.681 01/19/2021  ? ?Last vitamin D ?No results found for: 25OHVITD2, [place], VD25OH ?  ?  Objective  ?  ?BP 132/77   Pulse 80   Temp 98 ?F (36.7 ?C)   Ht 5' 2"  (1.575 m)   Wt 256 lb (116.1 kg)   SpO2 98%   BMI 46.82 kg/m?  ?BP Readings from Last 3 Encounters:  ?05/04/21 132/77  ?01/19/21 123/76  ? ?Wt Readings from  Last 3 Encounters:  ?05/04/21 256 lb (116.1 kg)  ?01/19/21 256 lb (116.1 kg)  ? ? ?Physical Exam  ?General:  Well Developed, well nourished, appropriate for stated age.  ?Neuro:  Alert and oriented,  extra-ocular muscles intact  ?HEENT:  Normocephalic, atraumatic, neck supple  ?Skin:  no gross rash, warm, pink. ?Cardiac:  RRR, S1 S2 ?Respiratory: CTA B/L  ?MSK: knee: joint line tenderness bilaterally, limited ROM with flexion/extension, no warmth, no effusion noted ?Vascular:  Ext warm, no cyanosis apprec.; cap RF less 2 sec. ?Psych:  No HI/SI, judgement and insight good, Euthymic mood. Full Affect. ? ? ?Results for orders placed or performed in visit on 05/04/21  ?POCT glycosylated hemoglobin (Hb A1C)   ?Result Value Ref Range  ? Hemoglobin A1C 11.1 (A) 4.0 - 5.6 %  ? HbA1c POC (<> result, manual entry)    ? HbA1c, POC (prediabetic range)    ? HbA1c, POC (controlled diabetic range)    ?POCT UA - Microalbumin  ?Result Value Ref Range  ? Microalbumin Ur, POC 30 mg/L  ? Creatinine, POC 200 mg/dL  ? Albumin/Creatinine Ratio, Urine, POC <30   ? ? Assessment & Plan  ?  ? ? ?Problem List Items Addressed This Visit   ? ?  ? Cardiovascular and Mediastinum  ? Hypertension associated with diabetes ([place])  ?  -Stable. ?-Continue current medication regimen. Will collect CMP to monitor renal function and electrolytes. ?-Will continue to monitor. ? ?  ?  ? Relevant Medications  ? Semaglutide (RYBELSUS) 14 MG TABS  ? glipiZIDE (GLUCOTROL) 5 MG tablet  ? pravastatin (PRAVACHOL) 10 MG tablet  ? Other Relevant Orders  ? CBC w/Diff  ? Comp Met (CMET)  ?  ? Endocrine  ? Type 2 diabetes mellitus with other specified complication ([place]) - Primary  ?  -Uncontrolled, A1c increased from 10.6 to 11.1, will increase Rybelsus to 14 mg and start Glipizide 5 mg to take before her largest meal of the day. Discussed potential side effects with Glipizide so advised to let me know if unable to tolerate. Advised to give me an update in 4 weeks if her fasting sugars are still >200 for further medication adjustments. Monitor for severe symptoms such as altered mental status, extreme thirst, sweats or palpitations and should seek immediate medical care. Pt defers injectable therapy. Pt verbalized understanding. Discussed diabetic diet.  ? ?  ?  ? Relevant Medications  ? Semaglutide (RYBELSUS) 14 MG TABS  ? glipiZIDE (GLUCOTROL) 5 MG tablet  ? pravastatin (PRAVACHOL) 10 MG tablet  ? Other Relevant Orders  ? POCT glycosylated hemoglobin (Hb A1C) (Completed)  ? Comp Met (CMET)  ? POCT UA - Microalbumin (Completed)  ? Hyperlipidemia associated with type 2 diabetes mellitus ([place])  ?  -Will collect lipid panel and hepatic function. ?-Recommend to follow a low  fat diet. ?-Continue current medication regimen. ?-Will continue to monitor. ? ?  ?  ? Relevant Medications  ? Semaglutide (RYBELSUS) 14 MG TABS  ? glipiZIDE (GLUCOTROL) 5 MG tablet  ? pravastatin (PRAVACHOL) 10 MG tablet  ? Other Relevant Orders  ? Comp Met (CMET)  ? Lipid Profile  ? ?Other Visit Diagnoses   ? ? Chronic pain of both knees      ? Relevant Medications  ? meloxicam (MOBIC) 7.5 MG tablet  ? Other Relevant Orders  ? Ambulatory referral to Orthopedic Surgery  ? ?  ? ?Chronic knee pain: ?-s/sx suggestive of osteoarthritis. Will place referral to Orthopedics for  further evaluation. In the past patient unable to tolerate Celebrex due to diarrhea. Will trial low dose of meloxicam 7 mg to take as needed for moderate pain.  ? ? ?Return in about 3 months (around 08/03/2021) for DM- inc med and added med, HTN, HLD.  ?   ? ? ? ?Lorrene Reid, PA-C  ?[facility] ?[phone] (phone) ?[phone] (fax) ? ?[facility] ?

## 2021-05-05 LAB — CBC WITH DIFFERENTIAL/PLATELET
Basophils Absolute: 0 10*3/uL (ref 0.0–0.2)
Basos: 0 %
EOS (ABSOLUTE): 0.2 10*3/uL (ref 0.0–0.4)
Eos: 2 %
Hematocrit: 44.5 % (ref 34.0–46.6)
Hemoglobin: 15 g/dL (ref 11.1–15.9)
Immature Grans (Abs): 0.1 10*3/uL (ref 0.0–0.1)
Immature Granulocytes: 1 %
Lymphocytes Absolute: 1.9 10*3/uL (ref 0.7–3.1)
Lymphs: 21 %
MCH: 29.5 pg (ref 26.6–33.0)
MCHC: 33.7 g/dL (ref 31.5–35.7)
MCV: 87 fL (ref 79–97)
Monocytes Absolute: 0.6 10*3/uL (ref 0.1–0.9)
Monocytes: 7 %
Neutrophils Absolute: 6.4 10*3/uL (ref 1.4–7.0)
Neutrophils: 69 %
Platelets: 233 10*3/uL (ref 150–450)
RBC: 5.09 x10E6/uL (ref 3.77–5.28)
RDW: 12.4 % (ref 11.7–15.4)
WBC: 9.1 10*3/uL (ref 3.4–10.8)

## 2021-05-05 LAB — COMPREHENSIVE METABOLIC PANEL
ALT: 89 IU/L — ABNORMAL HIGH (ref 0–32)
AST: 75 IU/L — ABNORMAL HIGH (ref 0–40)
Albumin/Globulin Ratio: 1.7 (ref 1.2–2.2)
Albumin: 4.5 g/dL (ref 3.8–4.9)
Alkaline Phosphatase: 87 IU/L (ref 44–121)
BUN/Creatinine Ratio: 15 (ref 9–23)
BUN: 10 mg/dL (ref 6–24)
Bilirubin Total: 0.4 mg/dL (ref 0.0–1.2)
CO2: 26 mmol/L (ref 20–29)
Calcium: 9.6 mg/dL (ref 8.7–10.2)
Chloride: 98 mmol/L (ref 96–106)
Creatinine, Ser: 0.68 mg/dL (ref 0.57–1.00)
Globulin, Total: 2.7 g/dL (ref 1.5–4.5)
Glucose: 322 mg/dL — ABNORMAL HIGH (ref 70–99)
Potassium: 4.7 mmol/L (ref 3.5–5.2)
Sodium: 139 mmol/L (ref 134–144)
Total Protein: 7.2 g/dL (ref 6.0–8.5)
eGFR: 100 mL/min/{1.73_m2} (ref >59)

## 2021-05-05 LAB — LIPID PANEL
Chol/HDL Ratio: 2.8 ratio (ref 0.0–4.4)
Cholesterol, Total: 147 mg/dL (ref 100–199)
HDL: 52 mg/dL (ref >39)
LDL Chol Calc (NIH): 80 mg/dL (ref 0–99)
Triglycerides: 74 mg/dL (ref 0–149)
VLDL Cholesterol Cal: 15 mg/dL (ref 5–40)

## 2021-05-10 ENCOUNTER — Encounter: Payer: Self-pay | Admitting: Physician Assistant

## 2021-05-25 ENCOUNTER — Encounter: Payer: Self-pay | Admitting: Physician Assistant

## 2021-05-31 ENCOUNTER — Other Ambulatory Visit: Payer: Self-pay | Admitting: Physician Assistant

## 2021-05-31 DIAGNOSIS — G8929 Other chronic pain: Secondary | ICD-10-CM

## 2021-08-02 NOTE — Patient Instructions (Signed)
Diabetes Mellitus and Foot Care Foot care is an important part of your health, especially when you have diabetes. Diabetes may cause you to have problems because of poor blood flow (circulation) to your feet and legs, which can cause your skin to: Become thinner and drier. Break more easily. Heal more slowly. Peel and crack. You may also have nerve damage (neuropathy) in your legs and feet, causing decreased feeling in them. This means that you may not notice minor injuries to your feet that could lead to more serious problems. Noticing and addressing any potential problems early is the best way to prevent future foot problems. How to care for your feet Foot hygiene  Wash your feet daily with warm water and mild soap. Do not use hot water. Then, pat your feet and the areas between your toes until they are completely dry. Do not soak your feet as this can dry your skin. Trim your toenails straight across. Do not dig under them or around the cuticle. File the edges of your nails with an emery board or nail file. Apply a moisturizing lotion or petroleum jelly to the skin on your feet and to dry, brittle toenails. Use lotion that does not contain alcohol and is unscented. Do not apply lotion between your toes. Shoes and socks Wear clean socks or stockings every day. Make sure they are not too tight. Do not wear knee-high stockings since they may decrease blood flow to your legs. Wear shoes that fit properly and have enough cushioning. Always look in your shoes before you put them on to be sure there are no objects inside. To break in new shoes, wear them for just a few hours a day. This prevents injuries on your feet. Wounds, scrapes, corns, and calluses  Check your feet daily for blisters, cuts, bruises, sores, and redness. If you cannot see the bottom of your feet, use a mirror or ask someone for help. Do not cut corns or calluses or try to remove them with medicine. If you find a minor scrape,  cut, or break in the skin on your feet, keep it and the skin around it clean and dry. You may clean these areas with mild soap and water. Do not clean the area with peroxide, alcohol, or iodine. If you have a wound, scrape, corn, or callus on your foot, look at it several times a day to make sure it is healing and not infected. Check for: Redness, swelling, or pain. Fluid or blood. Warmth. Pus or a bad smell. General tips Do not cross your legs. This may decrease blood flow to your feet. Do not use heating pads or hot water bottles on your feet. They may burn your skin. If you have lost feeling in your feet or legs, you may not know this is happening until it is too late. Protect your feet from hot and cold by wearing shoes, such as at the beach or on hot pavement. Schedule a complete foot exam at least once a year (annually) or more often if you have foot problems. Report any cuts, sores, or bruises to your health care provider immediately. Where to find more information American Diabetes Association: www.diabetes.org Association of Diabetes Care & Education Specialists: www.diabeteseducator.org Contact a health care provider if: You have a medical condition that increases your risk of infection and you have any cuts, sores, or bruises on your feet. You have an injury that is not healing. You have redness on your legs or feet. You   feel burning or tingling in your legs or feet. You have pain or cramps in your legs and feet. Your legs or feet are numb. Your feet always feel cold. You have pain around any toenails. Get help right away if: You have a wound, scrape, corn, or callus on your foot and: You have pain, swelling, or redness that gets worse. You have fluid or blood coming from the wound, scrape, corn, or callus. Your wound, scrape, corn, or callus feels warm to the touch. You have pus or a bad smell coming from the wound, scrape, corn, or callus. You have a fever. You have a red  line going up your leg. Summary Check your feet every day for blisters, cuts, bruises, sores, and redness. Apply a moisturizing lotion or petroleum jelly to the skin on your feet and to dry, brittle toenails. Wear shoes that fit properly and have enough cushioning. If you have foot problems, report any cuts, sores, or bruises to your health care provider immediately. Schedule a complete foot exam at least once a year (annually) or more often if you have foot problems. This information is not intended to replace advice given to you by your health care provider. Make sure you discuss any questions you have with your health care provider. Document Revised: 07/24/2019 Document Reviewed: 07/24/2019 Elsevier Patient Education  2023 [employer] Inc.  

## 2021-08-03 ENCOUNTER — Encounter: Payer: Self-pay | Admitting: Physician Assistant

## 2021-08-03 ENCOUNTER — Ambulatory Visit (INDEPENDENT_AMBULATORY_CARE_PROVIDER_SITE_OTHER): Payer: BC Managed Care – PPO | Admitting: Physician Assistant

## 2021-08-03 VITALS — BP 133/76 | HR 85 | Temp 97.7°F | Ht 62.0 in | Wt 259.0 lb

## 2021-08-03 DIAGNOSIS — G8929 Other chronic pain: Secondary | ICD-10-CM

## 2021-08-03 DIAGNOSIS — I152 Hypertension secondary to endocrine disorders: Secondary | ICD-10-CM

## 2021-08-03 DIAGNOSIS — M25562 Pain in left knee: Secondary | ICD-10-CM

## 2021-08-03 DIAGNOSIS — E1159 Type 2 diabetes mellitus with other circulatory complications: Secondary | ICD-10-CM

## 2021-08-03 DIAGNOSIS — E785 Hyperlipidemia, unspecified: Secondary | ICD-10-CM

## 2021-08-03 DIAGNOSIS — M25561 Pain in right knee: Secondary | ICD-10-CM

## 2021-08-03 DIAGNOSIS — E1169 Type 2 diabetes mellitus with other specified complication: Secondary | ICD-10-CM

## 2021-08-03 LAB — POCT GLYCOSYLATED HEMOGLOBIN (HGB A1C): Hemoglobin A1C: 8.1 % — AB (ref 4.0–5.6)

## 2021-08-03 MED ORDER — MELOXICAM 7.5 MG PO TABS: 7.5000 mg | ORAL_TABLET | Freq: Every day | ORAL | 0 refills | Status: DC

## 2021-08-03 MED ORDER — METFORMIN HCL ER 500 MG PO TB24: 500.0000 mg | ORAL_TABLET | Freq: Every day | ORAL | 1 refills | Status: DC

## 2021-08-03 MED ORDER — LISINOPRIL-HYDROCHLOROTHIAZIDE 20-25 MG PO TABS: 1.0000 | ORAL_TABLET | Freq: Every day | ORAL | 1 refills | Status: DC

## 2021-08-03 MED ORDER — RYBELSUS 14 MG PO TABS: 14.0000 mg | ORAL_TABLET | Freq: Every day | ORAL | 1 refills | Status: DC

## 2021-08-03 MED ORDER — PRAVASTATIN SODIUM 10 MG PO TABS: 10.0000 mg | ORAL_TABLET | Freq: Every day | ORAL | 1 refills | Status: DC

## 2021-08-03 MED ORDER — GLIPIZIDE 5 MG PO TABS: 5.0000 mg | ORAL_TABLET | Freq: Two times a day (BID) | ORAL | 3 refills | Status: DC

## 2021-08-03 NOTE — Assessment & Plan Note (Signed)
-  Patient currently on Rybelsus which can help with weight loss. Will place referral to medical weight management. Recommend diet and lifestyle interventions.

## 2021-08-03 NOTE — Progress Notes (Signed)
Established patient visit   Patient: Virginia Weeks   DOB: November 01, 1960   61 y.o. Female  MRN: [id number] Visit Date: 08/03/2021  Chief Complaint  Patient presents with   Diabetes   Subjective    HPI  Patient presents for chronic follow-up. Reports started PT but it is currently on hold until her toenail injury heals and she is able to wear close-toed shoes. Orthopedist also recommended weight loss, states has severe arthritis that is going to need surgery in the future. Patient reports was referred to weight loss clinic with [facility] but not covered by her insurance and was suppose to be referred to [place].   Diabetes mellitus: Pt denies increased urination or thirst. Pt reports medication compliance. No hypoglycemic events. Checking glucose at home. FBS are now running around 160-200.   HTN: Pt denies chest pain, palpitations, dizziness or lower extremity swelling. Taking medication as directed without side effects.   HLD: Pt taking medication as directed without issues. No diet changes.   Medications: Outpatient Medications Prior to Visit  Medication Sig   latanoprost (XALATAN) 0.005 % ophthalmic solution SMARTSIG:1 Drop(s) In Eye(s) Every Evening   Microlet Lancets MISC Use to check FBS up to 2 times daily   ONETOUCH VERIO test strip 1 each by Other route daily.   [DISCONTINUED] glipiZIDE (GLUCOTROL) 5 MG tablet Take 1 tablet (5 mg total) by mouth daily before supper.   [DISCONTINUED] lisinopril-hydrochlorothiazide (ZESTORETIC) 20-25 MG tablet TAKE 1 TABLET BY MOUTH EVERY DAY   [DISCONTINUED] meloxicam (MOBIC) 7.5 MG tablet TAKE 1 TABLET BY MOUTH EVERY DAY   [DISCONTINUED] metFORMIN (GLUCOPHAGE-XR) 500 MG 24 hr tablet TAKE 1 TABLET BY MOUTH EVERY DAY WITH BREAKFAST   [DISCONTINUED] pravastatin (PRAVACHOL) 10 MG tablet Take 1 tablet (10 mg total) by mouth daily.   [DISCONTINUED] Semaglutide (RYBELSUS) 14 MG TABS Take 1 tablet (14 mg total) by mouth daily.   No  facility-administered medications prior to visit.    Review of Systems Review of Systems:  A fourteen system review of systems was performed and found to be positive as per HPI.  Last CBC Lab Results  Component Value Date   WBC 9.1 05/04/2021   HGB 15.0 05/04/2021   HCT 44.5 05/04/2021   MCV 87 05/04/2021   MCH 29.5 05/04/2021   RDW 12.4 05/04/2021   PLT 233 07/37/1062   Last metabolic panel Lab Results  Component Value Date   GLUCOSE 322 (H) 05/04/2021   NA 139 05/04/2021   K 4.7 05/04/2021   CL 98 05/04/2021   CO2 26 05/04/2021   BUN 10 05/04/2021   CREATININE 0.68 05/04/2021   EGFR 100 05/04/2021   CALCIUM 9.6 05/04/2021   PROT 7.2 05/04/2021   ALBUMIN 4.5 05/04/2021   LABGLOB 2.7 05/04/2021   AGRATIO 1.7 05/04/2021   BILITOT 0.4 05/04/2021   ALKPHOS 87 05/04/2021   AST 75 (H) 05/04/2021   ALT 89 (H) 05/04/2021   Last lipids Lab Results  Component Value Date   CHOL 147 05/04/2021   HDL 52 05/04/2021   LDLCALC 80 05/04/2021   TRIG 74 05/04/2021   CHOLHDL 2.8 05/04/2021   Last hemoglobin A1c Lab Results  Component Value Date   HGBA1C 8.1 (A) 08/03/2021   Last thyroid functions Lab Results  Component Value Date   TSH 0.681 01/19/2021   Last vitamin D No results found for: "25OHVITD2", "25OHVITD3", "VD25OH"   Objective    BP 133/76   Pulse 85   Temp 97.7 F (36.5  C)   Ht _0  (1.575 m)   Wt 259 lb (117.5 kg)   LMP  (LMP Unknown)   SpO2 96%   BMI 47.37 kg/m  BP Readings from Last 3 Encounters:  08/03/21 133/76  05/04/21 132/77  01/19/21 123/76   Wt Readings from Last 3 Encounters:  08/03/21 259 lb (117.5 kg)  05/04/21 256 lb (116.1 kg)  01/19/21 256 lb (116.1 kg)    Physical Exam  General: Pleasant and cooperative, appropriate for stated age.  Neuro:  Alert and oriented,  extra-ocular muscles intact  HEENT:  Normocephalic, atraumatic, neck supple  Skin:  no gross rash, warm, pink. Cardiac:  RRR, S1 S2 Respiratory: CTA B/L   Vascular:  Ext warm, no cyanosis apprec.; cap RF less 2 sec. Psych:  No HI/SI, judgement and insight good, Euthymic mood. Full Affect.   Results for orders placed or performed in visit on 08/03/21  POCT glycosylated hemoglobin (Hb A1C)  Result Value Ref Range   Hemoglobin A1C 8.1 (A) 4.0 - 5.6 %   HbA1c POC (<> result, manual entry)     HbA1c, POC (prediabetic range)     HbA1c, POC (controlled diabetic range)      Assessment & Plan      Problem List Items Addressed This Visit       Cardiovascular and Mediastinum   Hypertension associated with diabetes ([place])    -Stable. -Continue current medication regimen. -Will continue to monitor.      Relevant Medications   glipiZIDE (GLUCOTROL) 5 MG tablet   lisinopril-hydrochlorothiazide (ZESTORETIC) 20-25 MG tablet   metFORMIN (GLUCOPHAGE-XR) 500 MG 24 hr tablet   pravastatin (PRAVACHOL) 10 MG tablet   Semaglutide (RYBELSUS) 14 MG TABS   Other Relevant Orders   Comprehensive metabolic panel   CBC w/Diff     Endocrine   Type 2 diabetes mellitus with other specified complication (HCC) - Primary    -A1c has improved from 11.1 to 8.1, goal is <7.5. Will increase Glipizide 5 mg to BID and continue Rybelsus 14 mg daily, Metformin XR 500 mg daily. If A1c remains above goal at f/up visit then will consider adding SGL2 I therapy such as [place]. Continue ambulatory glucose monitoring. Recommend to follow a diabetic diet. Will continue to monitor.      Relevant Medications   glipiZIDE (GLUCOTROL) 5 MG tablet   lisinopril-hydrochlorothiazide (ZESTORETIC) 20-25 MG tablet   metFORMIN (GLUCOPHAGE-XR) 500 MG 24 hr tablet   pravastatin (PRAVACHOL) 10 MG tablet   Semaglutide (RYBELSUS) 14 MG TABS   Other Relevant Orders   POCT glycosylated hemoglobin (Hb A1C) (Completed)   Comprehensive metabolic panel   CBC w/Diff   Hyperlipidemia associated with type 2 diabetes mellitus (HCC)    -Last lipid panel: LDL 80 (goal <70) -Will repeat lipid  panel and hepatic function today. Last liver enzymes elevated so will continue to monitor statin therapy closely. Recommend to follow a heart healthy diet low in fat.       Relevant Medications   glipiZIDE (GLUCOTROL) 5 MG tablet   lisinopril-hydrochlorothiazide (ZESTORETIC) 20-25 MG tablet   metFORMIN (GLUCOPHAGE-XR) 500 MG 24 hr tablet   pravastatin (PRAVACHOL) 10 MG tablet   Semaglutide (RYBELSUS) 14 MG TABS   Other Relevant Orders   Lipid Profile   Comprehensive metabolic panel   CBC w/Diff     Other   Morbid obesity with body mass index (BMI) of 40.0 or higher ([place])    -Patient currently on Rybelsus which can help with weight  loss. Will place referral to medical weight management. Recommend diet and lifestyle interventions.       Relevant Medications   glipiZIDE (GLUCOTROL) 5 MG tablet   metFORMIN (GLUCOPHAGE-XR) 500 MG 24 hr tablet   Semaglutide (RYBELSUS) 14 MG TABS   Other Relevant Orders   Amb Ref to Medical Weight Management   Other Visit Diagnoses     Chronic pain of both knees       Relevant Medications   meloxicam (MOBIC) 7.5 MG tablet       Return in about 3 months (around 11/03/2021) for DM- inc med, HTN, HLD.        Lorrene Reid, PA-C  [facility] at [facility] [phone] (phone) [phone] (fax)  [place]

## 2021-08-03 NOTE — Assessment & Plan Note (Addendum)
-  A1c has improved from 11.1 to 8.1, goal is <7.5. Will increase Glipizide 5 mg to BID and continue Rybelsus 14 mg daily, Metformin XR 500 mg daily. If A1c remains above goal at f/up visit then will consider adding SGL2 I therapy such as [place]. Continue ambulatory glucose monitoring. Recommend to follow a diabetic diet. Will continue to monitor.

## 2021-08-03 NOTE — Assessment & Plan Note (Signed)
-  Stable. -Continue current medication regimen.  -Will continue to monitor. 

## 2021-08-03 NOTE — Assessment & Plan Note (Signed)
-  Last lipid panel: LDL 80 (goal <70) -Will repeat lipid panel and hepatic function today. Last liver enzymes elevated so will continue to monitor statin therapy closely. Recommend to follow a heart healthy diet low in fat.

## 2021-08-04 ENCOUNTER — Other Ambulatory Visit: Payer: Self-pay | Admitting: Physician Assistant

## 2021-08-04 LAB — COMPREHENSIVE METABOLIC PANEL
ALT: 55 IU/L — ABNORMAL HIGH (ref 0–32)
AST: 48 IU/L — ABNORMAL HIGH (ref 0–40)
Albumin/Globulin Ratio: 1.3 (ref 1.2–2.2)
Albumin: 4 g/dL (ref 3.8–4.9)
Alkaline Phosphatase: 73 IU/L (ref 44–121)
BUN/Creatinine Ratio: 16 (ref 12–28)
BUN: 12 mg/dL (ref 8–27)
Bilirubin Total: 0.5 mg/dL (ref 0.0–1.2)
CO2: 28 mmol/L (ref 20–29)
Calcium: 9.6 mg/dL (ref 8.7–10.3)
Chloride: 105 mmol/L (ref 96–106)
Creatinine, Ser: 0.73 mg/dL (ref 0.57–1.00)
Globulin, Total: 3 g/dL (ref 1.5–4.5)
Glucose: 190 mg/dL — ABNORMAL HIGH (ref 70–99)
Potassium: 4.8 mmol/L (ref 3.5–5.2)
Sodium: 146 mmol/L — ABNORMAL HIGH (ref 134–144)
Total Protein: 7 g/dL (ref 6.0–8.5)
eGFR: 94 mL/min/{1.73_m2} (ref >59)

## 2021-08-04 LAB — CBC WITH DIFFERENTIAL/PLATELET
Basophils Absolute: 0 10*3/uL (ref 0.0–0.2)
Basos: 0 %
EOS (ABSOLUTE): 0.2 10*3/uL (ref 0.0–0.4)
Eos: 2 %
Hematocrit: 42.8 % (ref 34.0–46.6)
Hemoglobin: 14 g/dL (ref 11.1–15.9)
Immature Grans (Abs): 0.1 10*3/uL (ref 0.0–0.1)
Immature Granulocytes: 1 %
Lymphocytes Absolute: 2 10*3/uL (ref 0.7–3.1)
Lymphs: 23 %
MCH: 29.1 pg (ref 26.6–33.0)
MCHC: 32.7 g/dL (ref 31.5–35.7)
MCV: 89 fL (ref 79–97)
Monocytes Absolute: 0.6 10*3/uL (ref 0.1–0.9)
Monocytes: 6 %
Neutrophils Absolute: 6.2 10*3/uL (ref 1.4–7.0)
Neutrophils: 68 %
Platelets: 232 10*3/uL (ref 150–450)
RBC: 4.81 x10E6/uL (ref 3.77–5.28)
RDW: 12.6 % (ref 11.7–15.4)
WBC: 9.1 10*3/uL (ref 3.4–10.8)

## 2021-08-04 LAB — LIPID PANEL
Chol/HDL Ratio: 2.7 ratio (ref 0.0–4.4)
Cholesterol, Total: 135 mg/dL (ref 100–199)
HDL: 50 mg/dL (ref >39)
LDL Chol Calc (NIH): 71 mg/dL (ref 0–99)
Triglycerides: 67 mg/dL (ref 0–149)
VLDL Cholesterol Cal: 14 mg/dL (ref 5–40)

## 2021-09-05 ENCOUNTER — Other Ambulatory Visit: Payer: Self-pay | Admitting: Family Medicine

## 2021-09-05 ENCOUNTER — Other Ambulatory Visit: Payer: Self-pay | Admitting: Physician Assistant

## 2021-09-05 DIAGNOSIS — Z1231 Encounter for screening mammogram for malignant neoplasm of breast: Secondary | ICD-10-CM

## 2021-10-06 LAB — HM DIABETES EYE EXAM

## 2021-10-12 ENCOUNTER — Encounter: Payer: Self-pay | Admitting: Physician Assistant

## 2021-10-19 ENCOUNTER — Ambulatory Visit: Payer: BC Managed Care – PPO

## 2021-10-26 ENCOUNTER — Ambulatory Visit
Admission: RE | Admit: 2021-10-26 | Discharge: 2021-10-26 | Disposition: A | Discharge status: Home / Self Care | Payer: BC Managed Care – PPO | Source: Ambulatory Visit | Attending: Physician Assistant | Admitting: Physician Assistant

## 2021-10-26 DIAGNOSIS — Z1231 Encounter for screening mammogram for malignant neoplasm of breast: Secondary | ICD-10-CM

## 2021-10-27 ENCOUNTER — Other Ambulatory Visit: Payer: Self-pay | Admitting: Physician Assistant

## 2021-10-27 DIAGNOSIS — R928 Other abnormal and inconclusive findings on diagnostic imaging of breast: Secondary | ICD-10-CM

## 2021-11-02 ENCOUNTER — Ambulatory Visit: Payer: BC Managed Care – PPO | Admitting: Physician Assistant

## 2021-11-09 ENCOUNTER — Ambulatory Visit
Admission: RE | Admit: 2021-11-09 | Discharge: 2021-11-09 | Disposition: A | Discharge status: Home / Self Care | Payer: BC Managed Care – PPO | Source: Ambulatory Visit | Attending: Physician Assistant | Admitting: Physician Assistant

## 2021-11-09 DIAGNOSIS — R928 Other abnormal and inconclusive findings on diagnostic imaging of breast: Secondary | ICD-10-CM

## 2021-11-10 ENCOUNTER — Encounter: Payer: Self-pay | Admitting: Physician Assistant

## 2021-11-10 ENCOUNTER — Ambulatory Visit (INDEPENDENT_AMBULATORY_CARE_PROVIDER_SITE_OTHER): Payer: BC Managed Care – PPO | Admitting: Physician Assistant

## 2021-11-10 VITALS — BP 123/74 | HR 71 | Ht 62.0 in | Wt 260.1 lb

## 2021-11-10 DIAGNOSIS — E1169 Type 2 diabetes mellitus with other specified complication: Secondary | ICD-10-CM

## 2021-11-10 DIAGNOSIS — I152 Hypertension secondary to endocrine disorders: Secondary | ICD-10-CM | POA: Diagnosis not present

## 2021-11-10 DIAGNOSIS — E785 Hyperlipidemia, unspecified: Secondary | ICD-10-CM | POA: Diagnosis not present

## 2021-11-10 DIAGNOSIS — E1159 Type 2 diabetes mellitus with other circulatory complications: Secondary | ICD-10-CM

## 2021-11-10 LAB — POCT GLYCOSYLATED HEMOGLOBIN (HGB A1C): HbA1c POC (<> result, manual entry): 7.6 % (ref 4.0–5.6)

## 2021-11-10 MED ORDER — OZEMPIC (0.25 OR 0.5 MG/DOSE) 2 MG/1.5ML ~~LOC~~ SOPN: PEN_INJECTOR | SUBCUTANEOUS | 3 refills | Status: DC

## 2021-11-10 NOTE — Assessment & Plan Note (Signed)
-  A1c continues to gradually improve, A1c today 7.6. Patient wants to try a once a week injection so will discontinue Rybelsus 14 mg and start Ozempic 0.25 mg once a week for 4 weeks and then increase to 0.5 mg once a week. Will continue Metformin 500 mg once daily. Will discontinue glipizide 5 mg twice daily. Advised patient to continue monitoring glucose at home and notify the office if sugars start to run higher than current readings for medication adjustments. Pt verbalized understanding. Recommend to follow a diabetic diet. Recommend to follow-up in 3 months to repeat A1c and reassess medication therapy.

## 2021-11-10 NOTE — Assessment & Plan Note (Signed)
-  Last lipid panel normal, LDL 71 (goal<70). Will repeat lipid panel and hepatic function today. Recommend to continue pravastatin 10 mg daily.

## 2021-11-10 NOTE — Patient Instructions (Signed)

## 2021-11-10 NOTE — Assessment & Plan Note (Signed)
-  Controlled. Continue Lisinopril-HCTZ 20-25 mg once daily. Will collect CMP to monitor renal function and electrolytes.

## 2021-11-10 NOTE — Progress Notes (Signed)
Established patient visit   Patient: Virginia Weeks   DOB: Nov 29, 1960   61 y.o. Female  MRN: [id number] Visit Date: 11/10/2021  Chief Complaint  Patient presents with   Follow-up   Subjective    HPI  Patient presents for chronic follow-up.   Diabetes mellitus: No increased urination or thirst. Pt reports medication compliance, states it has been difficult to remember taking her evening dose before supper of glipizide. No hypoglycemic events. Checking glucose at home. FBS range from 140-165. Reports recent stress secondary to her mammogram imaging which resulted benign so feels better.   HTN: Pt denies chest pain, palpitations, dizziness or lower extremity swelling. Taking medication as directed without side effects.   HLD: Pt taking pravastatin 10 mg without issues.   Medications: Outpatient Medications Prior to Visit  Medication Sig   latanoprost (XALATAN) 0.005 % ophthalmic solution SMARTSIG:1 Drop(s) In Eye(s) Every Evening   lisinopril-hydrochlorothiazide (ZESTORETIC) 20-25 MG tablet Take 1 tablet by mouth daily.   meloxicam (MOBIC) 7.5 MG tablet Take 1 tablet (7.5 mg total) by mouth daily.   metFORMIN (GLUCOPHAGE-XR) 500 MG 24 hr tablet Take 1 tablet (500 mg total) by mouth daily with breakfast.   Microlet Lancets MISC Use to check FBS up to 2 times daily   ONETOUCH VERIO test strip 1 each by Other route daily.   pravastatin (PRAVACHOL) 10 MG tablet Take 1 tablet (10 mg total) by mouth daily.   [DISCONTINUED] glipiZIDE (GLUCOTROL) 5 MG tablet Take 1 tablet (5 mg total) by mouth 2 (two) times daily before a meal.   [DISCONTINUED] Semaglutide (RYBELSUS) 14 MG TABS Take 1 tablet (14 mg total) by mouth daily.   No facility-administered medications prior to visit.    Review of Systems Review of Systems:  A fourteen system review of systems was performed and found to be positive as per HPI.  Last CBC Lab Results  Component Value Date   WBC 9.1 08/03/2021   HGB 14.0  08/03/2021   HCT 42.8 08/03/2021   MCV 89 08/03/2021   MCH 29.1 08/03/2021   RDW 12.6 08/03/2021   PLT 232 09/47/0962   Last metabolic panel Lab Results  Component Value Date   GLUCOSE 190 (H) 08/03/2021   NA 146 (H) 08/03/2021   K 4.8 08/03/2021   CL 105 08/03/2021   CO2 28 08/03/2021   BUN 12 08/03/2021   CREATININE 0.73 08/03/2021   EGFR 94 08/03/2021   CALCIUM 9.6 08/03/2021   PROT 7.0 08/03/2021   ALBUMIN 4.0 08/03/2021   LABGLOB 3.0 08/03/2021   AGRATIO 1.3 08/03/2021   BILITOT 0.5 08/03/2021   ALKPHOS 73 08/03/2021   AST 48 (H) 08/03/2021   ALT 55 (H) 08/03/2021   Last lipids Lab Results  Component Value Date   CHOL 135 08/03/2021   HDL 50 08/03/2021   LDLCALC 71 08/03/2021   TRIG 67 08/03/2021   CHOLHDL 2.7 08/03/2021   Last hemoglobin A1c Lab Results  Component Value Date   HGBA1C 7.6 11/10/2021   Last thyroid functions Lab Results  Component Value Date   TSH 0.681 01/19/2021   Last vitamin D No results found for: "25OHVITD2", "25OHVITD3", "VD25OH"   Objective    BP 123/74   Pulse 71   Ht _0  (1.575 m)   Wt 260 lb 1.9 oz (118 kg)   SpO2 98%   BMI 47.58 kg/m  BP Readings from Last 3 Encounters:  11/10/21 123/74  08/03/21 133/76  05/04/21 132/77   Wt Readings  from Last 3 Encounters:  11/10/21 260 lb 1.9 oz (118 kg)  08/03/21 259 lb (117.5 kg)  05/04/21 256 lb (116.1 kg)    Physical Exam  General:  Well Developed, well nourished, appropriate for stated age.  Neuro:  Alert and oriented,  extra-ocular muscles intact  HEENT:  Normocephalic, atraumatic, neck supple  Skin:  no gross rash, warm, pink. Cardiac:  RRR, S1 S2 Respiratory: CTA B/L  Vascular:  Ext warm, no cyanosis apprec.; cap RF less 2 sec. Psych:  No HI/SI, judgement and insight good, Euthymic mood. Full Affect.   Results for orders placed or performed in visit on 11/10/21  POCT glycosylated hemoglobin (Hb A1C)  Result Value Ref Range   Hemoglobin A1C     HbA1c POC  (<> result, manual entry) 7.6 4.0 - 5.6 %   HbA1c, POC (prediabetic range)     HbA1c, POC (controlled diabetic range)      Assessment & Plan      Problem List Items Addressed This Visit       Cardiovascular and Mediastinum   Hypertension associated with diabetes ([place])    -Controlled. Continue Lisinopril-HCTZ 20-25 mg once daily. Will collect CMP to monitor renal function and electrolytes.      Relevant Medications   Semaglutide,0.25 or 0.5MG/DOS, (OZEMPIC, 0.25 OR 0.5 MG/DOSE,) 2 MG/1.5ML SOPN   Other Relevant Orders   CBC with Differential/Platelet   Comprehensive metabolic panel   POCT glycosylated hemoglobin (Hb A1C) (Completed)     Endocrine   Type 2 diabetes mellitus with other specified complication (HCC) - Primary    -A1c continues to gradually improve, A1c today 7.6. Patient wants to try a once a week injection so will discontinue Rybelsus 14 mg and start Ozempic 0.25 mg once a week for 4 weeks and then increase to 0.5 mg once a week. Will continue Metformin 500 mg once daily. Will discontinue glipizide 5 mg twice daily. Advised patient to continue monitoring glucose at home and notify the office if sugars start to run higher than current readings for medication adjustments. Pt verbalized understanding. Recommend to follow a diabetic diet. Recommend to follow-up in 3 months to repeat A1c and reassess medication therapy.      Relevant Medications   Semaglutide,0.25 or 0.5MG/DOS, (OZEMPIC, 0.25 OR 0.5 MG/DOSE,) 2 MG/1.5ML SOPN   Other Relevant Orders   CBC with Differential/Platelet   Comprehensive metabolic panel   POCT glycosylated hemoglobin (Hb A1C) (Completed)   Hyperlipidemia associated with type 2 diabetes mellitus (HCC)    -Last lipid panel normal, LDL 71 (goal<70). Will repeat lipid panel and hepatic function today. Recommend to continue pravastatin 10 mg daily.       Relevant Medications   Semaglutide,0.25 or 0.5MG/DOS, (OZEMPIC, 0.25 OR 0.5 MG/DOSE,) 2 MG/1.5ML  SOPN   Other Relevant Orders   CBC with Differential/Platelet   Comprehensive metabolic panel   Lipid Profile   POCT glycosylated hemoglobin (Hb A1C) (Completed)    Return in about 3 months (around 02/10/2022) for DM, HTN, HLD.        Lorrene Reid, PA-C  [facility] at [facility] [phone] (phone) [phone] (fax)  [place]

## 2021-11-11 LAB — CBC WITH DIFFERENTIAL/PLATELET
Basophils Absolute: 0 10*3/uL (ref 0.0–0.2)
Basos: 0 %
EOS (ABSOLUTE): 0.2 10*3/uL (ref 0.0–0.4)
Eos: 2 %
Hematocrit: 41.5 % (ref 34.0–46.6)
Hemoglobin: 13.8 g/dL (ref 11.1–15.9)
Immature Grans (Abs): 0 10*3/uL (ref 0.0–0.1)
Immature Granulocytes: 0 %
Lymphocytes Absolute: 2.2 10*3/uL (ref 0.7–3.1)
Lymphs: 24 %
MCH: 29 pg (ref 26.6–33.0)
MCHC: 33.3 g/dL (ref 31.5–35.7)
MCV: 87 fL (ref 79–97)
Monocytes Absolute: 0.7 10*3/uL (ref 0.1–0.9)
Monocytes: 7 %
Neutrophils Absolute: 6 10*3/uL (ref 1.4–7.0)
Neutrophils: 67 %
Platelets: 230 10*3/uL (ref 150–450)
RBC: 4.76 x10E6/uL (ref 3.77–5.28)
RDW: 12 % (ref 11.7–15.4)
WBC: 9 10*3/uL (ref 3.4–10.8)

## 2021-11-11 LAB — COMPREHENSIVE METABOLIC PANEL
ALT: 78 IU/L — ABNORMAL HIGH (ref 0–32)
AST: 76 IU/L — ABNORMAL HIGH (ref 0–40)
Albumin/Globulin Ratio: 1.3 (ref 1.2–2.2)
Albumin: 4.1 g/dL (ref 3.8–4.9)
Alkaline Phosphatase: 70 IU/L (ref 44–121)
BUN/Creatinine Ratio: 16 (ref 12–28)
BUN: 10 mg/dL (ref 8–27)
Bilirubin Total: 0.6 mg/dL (ref 0.0–1.2)
CO2: 25 mmol/L (ref 20–29)
Calcium: 9.2 mg/dL (ref 8.7–10.3)
Chloride: 102 mmol/L (ref 96–106)
Creatinine, Ser: 0.63 mg/dL (ref 0.57–1.00)
Globulin, Total: 3.1 g/dL (ref 1.5–4.5)
Glucose: 148 mg/dL — ABNORMAL HIGH (ref 70–99)
Potassium: 3.9 mmol/L (ref 3.5–5.2)
Sodium: 141 mmol/L (ref 134–144)
Total Protein: 7.2 g/dL (ref 6.0–8.5)
eGFR: 101 mL/min/{1.73_m2} (ref >59)

## 2021-11-11 LAB — LIPID PANEL
Chol/HDL Ratio: 2.8 ratio (ref 0.0–4.4)
Cholesterol, Total: 132 mg/dL (ref 100–199)
HDL: 47 mg/dL (ref >39)
LDL Chol Calc (NIH): 71 mg/dL (ref 0–99)
Triglycerides: 65 mg/dL (ref 0–149)
VLDL Cholesterol Cal: 14 mg/dL (ref 5–40)

## 2021-11-23 ENCOUNTER — Other Ambulatory Visit: Payer: Self-pay | Admitting: Physician Assistant

## 2021-11-23 DIAGNOSIS — E1169 Type 2 diabetes mellitus with other specified complication: Secondary | ICD-10-CM

## 2021-12-12 ENCOUNTER — Encounter: Payer: Self-pay | Admitting: Nurse Practitioner

## 2021-12-12 ENCOUNTER — Other Ambulatory Visit: Payer: Self-pay | Admitting: Nurse Practitioner

## 2021-12-12 DIAGNOSIS — B3731 Acute candidiasis of vulva and vagina: Secondary | ICD-10-CM

## 2021-12-12 MED ORDER — NYSTATIN 100000 UNIT/GM EX OINT: 1.0000 | TOPICAL_OINTMENT | Freq: Two times a day (BID) | CUTANEOUS | 1 refills | Status: DC

## 2021-12-12 MED ORDER — FLUCONAZOLE 150 MG PO TABS: ORAL_TABLET | ORAL | 0 refills | Status: DC

## 2021-12-28 ENCOUNTER — Other Ambulatory Visit: Payer: Self-pay | Admitting: Nurse Practitioner

## 2021-12-28 ENCOUNTER — Encounter: Payer: Self-pay | Admitting: Nurse Practitioner

## 2021-12-28 DIAGNOSIS — E1169 Type 2 diabetes mellitus with other specified complication: Secondary | ICD-10-CM

## 2021-12-28 MED ORDER — SEMAGLUTIDE (1 MG/DOSE) 4 MG/3ML ~~LOC~~ SOPN
1.0000 mg | PEN_INJECTOR | SUBCUTANEOUS | 2 refills | Status: DC
  Filled 2022-02-04: qty 3, 28d supply, fill #0
  Filled 2022-02-24 – 2022-02-25 (×3): qty 3, 28d supply, fill #1

## 2022-01-27 ENCOUNTER — Encounter: Payer: Self-pay | Admitting: Nurse Practitioner

## 2022-02-04 ENCOUNTER — Other Ambulatory Visit (HOSPITAL_BASED_OUTPATIENT_CLINIC_OR_DEPARTMENT_OTHER): Payer: Self-pay

## 2022-02-10 ENCOUNTER — Ambulatory Visit: Payer: BC Managed Care – PPO | Admitting: Nurse Practitioner

## 2022-02-24 ENCOUNTER — Other Ambulatory Visit (HOSPITAL_BASED_OUTPATIENT_CLINIC_OR_DEPARTMENT_OTHER): Payer: Self-pay

## 2022-02-25 ENCOUNTER — Other Ambulatory Visit (HOSPITAL_BASED_OUTPATIENT_CLINIC_OR_DEPARTMENT_OTHER): Payer: Self-pay

## 2022-02-28 ENCOUNTER — Encounter: Payer: Self-pay | Admitting: Family Medicine

## 2022-02-28 DIAGNOSIS — E1169 Type 2 diabetes mellitus with other specified complication: Secondary | ICD-10-CM

## 2022-03-01 MED ORDER — PRAVASTATIN SODIUM 10 MG PO TABS: 10.0000 mg | ORAL_TABLET | Freq: Every day | ORAL | 0 refills | Status: DC

## 2022-03-06 ENCOUNTER — Telehealth: Payer: Self-pay

## 2022-03-06 DIAGNOSIS — E1169 Type 2 diabetes mellitus with other specified complication: Secondary | ICD-10-CM

## 2022-03-06 MED ORDER — ONETOUCH VERIO VI STRP: 1.0000 | ORAL_STRIP | Freq: Every day | 2 refills | Status: AC

## 2022-03-06 NOTE — Telephone Encounter (Signed)
Refill sent to [facility]

## 2022-03-06 NOTE — Addendum Note (Signed)
Addended by: Gemma Payor on: 03/06/2022 11:16 AM   Modules accepted: Orders

## 2022-03-06 NOTE — Telephone Encounter (Signed)
Pt would like it to stay with [facility] wasn't sure if they would get the supply.

## 2022-03-06 NOTE — Telephone Encounter (Signed)
Received fax from [facility] requesting refill on One Tough Verio strips. LVM requesting return call for confirmation that this is the correct pharmacy.

## 2022-03-24 ENCOUNTER — Ambulatory Visit: Payer: BC Managed Care – PPO | Admitting: Family Medicine

## 2022-03-28 ENCOUNTER — Ambulatory Visit: Payer: BC Managed Care – PPO | Admitting: Family Medicine

## 2022-03-29 ENCOUNTER — Other Ambulatory Visit: Payer: Self-pay | Admitting: Nurse Practitioner

## 2022-03-29 DIAGNOSIS — E1169 Type 2 diabetes mellitus with other specified complication: Secondary | ICD-10-CM

## 2022-03-30 ENCOUNTER — Other Ambulatory Visit (HOSPITAL_BASED_OUTPATIENT_CLINIC_OR_DEPARTMENT_OTHER): Payer: Self-pay

## 2022-03-30 MED ORDER — OZEMPIC (1 MG/DOSE) 4 MG/3ML ~~LOC~~ SOPN
1.0000 mg | PEN_INJECTOR | SUBCUTANEOUS | 2 refills | Status: DC
  Filled 2022-03-30: qty 3, 28d supply, fill #0

## 2022-04-05 ENCOUNTER — Other Ambulatory Visit (HOSPITAL_BASED_OUTPATIENT_CLINIC_OR_DEPARTMENT_OTHER): Payer: Self-pay

## 2022-04-10 ENCOUNTER — Other Ambulatory Visit: Payer: Self-pay | Admitting: Family Medicine

## 2022-04-10 DIAGNOSIS — E1169 Type 2 diabetes mellitus with other specified complication: Secondary | ICD-10-CM

## 2022-04-19 ENCOUNTER — Ambulatory Visit: Payer: BC Managed Care – PPO | Admitting: Family Medicine

## 2022-04-19 ENCOUNTER — Encounter: Payer: Self-pay | Admitting: Family Medicine

## 2022-04-19 VITALS — BP 115/60 | HR 76 | Resp 18 | Ht 62.0 in | Wt 255.0 lb

## 2022-04-19 DIAGNOSIS — E1169 Type 2 diabetes mellitus with other specified complication: Secondary | ICD-10-CM | POA: Diagnosis not present

## 2022-04-19 DIAGNOSIS — E785 Hyperlipidemia, unspecified: Secondary | ICD-10-CM | POA: Diagnosis not present

## 2022-04-19 DIAGNOSIS — N952 Postmenopausal atrophic vaginitis: Secondary | ICD-10-CM | POA: Diagnosis not present

## 2022-04-19 DIAGNOSIS — E1159 Type 2 diabetes mellitus with other circulatory complications: Secondary | ICD-10-CM | POA: Diagnosis not present

## 2022-04-19 DIAGNOSIS — I152 Hypertension secondary to endocrine disorders: Secondary | ICD-10-CM

## 2022-04-19 MED ORDER — PRAVASTATIN SODIUM 10 MG PO TABS: 10.0000 mg | ORAL_TABLET | Freq: Every day | ORAL | 0 refills | Status: DC

## 2022-04-19 MED ORDER — LISINOPRIL-HYDROCHLOROTHIAZIDE 20-25 MG PO TABS: 1.0000 | ORAL_TABLET | Freq: Every day | ORAL | 1 refills | Status: DC

## 2022-04-19 MED ORDER — METFORMIN HCL ER 500 MG PO TB24: 500.0000 mg | ORAL_TABLET | Freq: Every day | ORAL | 1 refills | Status: DC

## 2022-04-19 MED ORDER — OZEMPIC (1 MG/DOSE) 4 MG/3ML ~~LOC~~ SOPN: 1.0000 mg | PEN_INJECTOR | SUBCUTANEOUS | 2 refills | Status: DC

## 2022-04-19 NOTE — Assessment & Plan Note (Signed)
Last A1c 8.1, repeating A1c today.  Current dose of Ozempic is likely the cause of her constipation.  We discussed managing with water intake goal of at least 64 ounces daily and fiber intake goal of at least 25 g daily.  Provided printout with common food items with high fiber content.  We also discussed that it is safe to use PEG daily.  Depending on A1c results, if medication adjustment is necessary we may consider increasing metformin before we increase Ozempic given the constipation that has been developed.

## 2022-04-19 NOTE — Assessment & Plan Note (Signed)
We discussed that vaginal atrophy/atrophic vaginitis is fairly common as females get older due to the decrease in estrogen.  Encouraged her to continue using K-Y jelly and other lubricants to make intercourse more comfortable for her.  We also discussed other treatment options including topical application of coconut oil or topical estrogen cream.  We discussed common side effects for both.  Patient would like to try coconut oil twice a day before trying a prescription medication.  I provided her some printed educational materials to read at home about topical estrogen treatment for vaginal atrophy.  In the meantime, she will try the coconut oil and send me a message about how it is going and if she would like to switch to something stronger.

## 2022-04-19 NOTE — Patient Instructions (Signed)
Start taking MiraLAX (generic name is polyethylene glycol or PEG) daily to help with constipation.  Your goal should also be to have at least 64 ounces of water a day and at least 25 g of fiber a day.  Start using coconut oil applied vaginally up to 3 times a day to help with vaginal dryness.  Sending message to let me know how it is going and if we need to talk about the topical estrogen.  If you want to look more into the vaginal estrogen therapy, you can look up the brand name EstroGel for more information.

## 2022-04-19 NOTE — Assessment & Plan Note (Signed)
Stable.  Continue lisinopril-HCTZ 20-25 mg daily.  Collecting CMP for electrolytes and renal function.  Will continue to monitor.

## 2022-04-19 NOTE — Progress Notes (Signed)
Established Patient Office Visit  Subjective   Patient ID: Virginia Weeks, female    DOB: July 04, 1960  Age: 62 y.o. MRN: [id number]  Chief Complaint  Patient presents with   Diabetes   Hyperlipidemia   Hypertension   Constipation    HPI Virginia Weeks is a 62 y.o. female presenting today for follow up of hypertension, hyperlipidemia, diabetes.  She is also complaining of constipation and vaginal dryness.  She drinks a lot of water, takes a daily fiber supplement in the morning, and has at least 1 serving of leafy greens every night at dinnertime.  She has been experiencing dyspareunia even when using K-Y jelly and Astro glide. Hypertension: Patient here for follow-up of elevated blood pressure.  Pt denies chest pain, SOB, dizziness, edema, syncope, fatigue or heart palpitations. Taking lisinopril-HCTZ, reports excellent compliance with treatment. Denies side effects. Hyperlipidemia: tolerating pravastatin well with no myalgias or significant side effects. Currently consuming a general diet.  The 10-year ASCVD risk score (Arnett DK, et al., 2019) is: 8.6% Diabetes: denies hypoglycemic events, wounds or sores that are not healing well, increased thirst or urination. Denies vision problems, eye exam completed September 2023. Taking metformin as prescribed without any side effects.  She is also taking Ozempic 1 mg weekly which may be causing her constipation.    ROS Negative unless otherwise noted in HPI   Objective:     BP 115/60 (BP Location: Left Arm, Patient Position: Sitting, Cuff Size: Large)   Pulse 76   Resp 18   Ht 5\' 2"  (1.575 m)   Wt 255 lb (115.7 kg)   SpO2 99%   BMI 46.64 kg/m   Physical Exam Constitutional:      General: She is not in acute distress.    Appearance: Normal appearance.  HENT:     Head: Normocephalic and atraumatic.  Eyes:     Extraocular Movements: Extraocular movements intact.     Conjunctiva/sclera: Conjunctivae normal.  Cardiovascular:      Rate and Rhythm: Normal rate and regular rhythm.     Heart sounds: Normal heart sounds. No murmur heard.    No friction rub. No gallop.  Pulmonary:     Effort: Pulmonary effort is normal. No respiratory distress.     Breath sounds: No wheezing, rhonchi or rales.  Skin:    General: Skin is warm and dry.  Neurological:     Mental Status: She is alert and oriented to person, place, and time.     Assessment & Plan:  Hypertension associated with diabetes Assessment & Plan: Stable.  Continue lisinopril-HCTZ 20-25 mg daily.  Collecting CMP for electrolytes and renal function.  Will continue to monitor.  Orders: -     Lisinopril-hydroCHLOROthiazide; Take 1 tablet by mouth daily.  Dispense: 90 tablet; Refill: 1 -     CBC with Differential/Platelet; Future -     Comprehensive metabolic panel; Future  Hyperlipidemia associated with type 2 diabetes mellitus Assessment & Plan: Last lipid panel: LDL 71, HDL 47, triglycerides 65.  Repeating lipid panel and CMP for hepatic function today.  Continue pravastatin 10 mg daily unless lab results indicate necessary change.  Orders: -     Pravastatin Sodium; Take 1 tablet (10 mg total) by mouth daily.  Dispense: 30 tablet; Refill: 0 -     Lipid panel; Future  Type 2 diabetes mellitus with other specified complication, without long-term current use of insulin Assessment & Plan: Last A1c 8.1, repeating A1c today.  Current dose of Ozempic  is likely the cause of her constipation.  We discussed managing with water intake goal of at least 64 ounces daily and fiber intake goal of at least 25 g daily.  Provided printout with common food items with high fiber content.  We also discussed that it is safe to use PEG daily.  Depending on A1c results, if medication adjustment is necessary we may consider increasing metformin before we increase Ozempic given the constipation that has been developed.  Orders: -     metFORMIN HCl ER; Take 1 tablet (500 mg total) by mouth  daily with breakfast.  Dispense: 90 tablet; Refill: 1 -     Ozempic (1 MG/DOSE); Inject 1 mg into the skin once a week.  Dispense: 3 mL; Refill: 2 -     Hemoglobin A1c; Future  Vaginal atrophy Assessment & Plan: We discussed that vaginal atrophy/atrophic vaginitis is fairly common as females get older due to the decrease in estrogen.  Encouraged her to continue using K-Y jelly and other lubricants to make intercourse more comfortable for her.  We also discussed other treatment options including topical application of coconut oil or topical estrogen cream.  We discussed common side effects for both.  Patient would like to try coconut oil twice a day before trying a prescription medication.  I provided her some printed educational materials to read at home about topical estrogen treatment for vaginal atrophy.  In the meantime, she will try the coconut oil and send me a message about how it is going and if she would like to switch to something stronger.     Return in about 3 months (around 07/19/2022) for follow-up, fasting blood work 1 week before.    Velva Harman, PA

## 2022-04-19 NOTE — Assessment & Plan Note (Signed)
Last lipid panel: LDL 71, HDL 47, triglycerides 65.  Repeating lipid panel and CMP for hepatic function today.  Continue pravastatin 10 mg daily unless lab results indicate necessary change.

## 2022-06-03 ENCOUNTER — Other Ambulatory Visit: Payer: Self-pay | Admitting: Family Medicine

## 2022-06-03 DIAGNOSIS — E1169 Type 2 diabetes mellitus with other specified complication: Secondary | ICD-10-CM

## 2022-07-13 ENCOUNTER — Other Ambulatory Visit: Payer: Self-pay | Admitting: Family Medicine

## 2022-07-13 DIAGNOSIS — E1169 Type 2 diabetes mellitus with other specified complication: Secondary | ICD-10-CM

## 2022-08-02 ENCOUNTER — Other Ambulatory Visit: Payer: BC Managed Care – PPO

## 2022-08-02 DIAGNOSIS — I152 Hypertension secondary to endocrine disorders: Secondary | ICD-10-CM

## 2022-08-02 DIAGNOSIS — E1169 Type 2 diabetes mellitus with other specified complication: Secondary | ICD-10-CM

## 2022-08-03 LAB — CBC WITH DIFFERENTIAL/PLATELET
Basophils Absolute: 0 10*3/uL (ref 0.0–0.2)
Basos: 0 %
EOS (ABSOLUTE): 0.1 10*3/uL (ref 0.0–0.4)
Eos: 1 %
Hematocrit: 39.9 % (ref 34.0–46.6)
Hemoglobin: 13 g/dL (ref 11.1–15.9)
Immature Grans (Abs): 0 10*3/uL (ref 0.0–0.1)
Immature Granulocytes: 0 %
Lymphocytes Absolute: 2.3 10*3/uL (ref 0.7–3.1)
Lymphs: 25 %
MCH: 28.9 pg (ref 26.6–33.0)
MCHC: 32.6 g/dL (ref 31.5–35.7)
MCV: 89 fL (ref 79–97)
Monocytes Absolute: 0.6 10*3/uL (ref 0.1–0.9)
Monocytes: 7 %
Neutrophils Absolute: 6 10*3/uL (ref 1.4–7.0)
Neutrophils: 67 %
Platelets: 235 10*3/uL (ref 150–450)
RBC: 4.5 x10E6/uL (ref 3.77–5.28)
RDW: 12.4 % (ref 11.7–15.4)
WBC: 9.1 10*3/uL (ref 3.4–10.8)

## 2022-08-03 LAB — COMPREHENSIVE METABOLIC PANEL
ALT: 38 IU/L — ABNORMAL HIGH (ref 0–32)
AST: 39 IU/L (ref 0–40)
Albumin: 4.1 g/dL (ref 3.9–4.9)
Alkaline Phosphatase: 65 IU/L (ref 44–121)
BUN/Creatinine Ratio: 15 (ref 12–28)
BUN: 11 mg/dL (ref 8–27)
Bilirubin Total: 0.5 mg/dL (ref 0.0–1.2)
CO2: 27 mmol/L (ref 20–29)
Calcium: 9.5 mg/dL (ref 8.7–10.3)
Chloride: 101 mmol/L (ref 96–106)
Creatinine, Ser: 0.72 mg/dL (ref 0.57–1.00)
Globulin, Total: 2.5 g/dL (ref 1.5–4.5)
Glucose: 126 mg/dL — ABNORMAL HIGH (ref 70–99)
Potassium: 4.3 mmol/L (ref 3.5–5.2)
Sodium: 141 mmol/L (ref 134–144)
Total Protein: 6.6 g/dL (ref 6.0–8.5)
eGFR: 95 mL/min/{1.73_m2} (ref >59)

## 2022-08-03 LAB — LIPID PANEL
Chol/HDL Ratio: 2.5 ratio (ref 0.0–4.4)
Cholesterol, Total: 135 mg/dL (ref 100–199)
HDL: 54 mg/dL (ref >39)
LDL Chol Calc (NIH): 68 mg/dL (ref 0–99)
Triglycerides: 65 mg/dL (ref 0–149)
VLDL Cholesterol Cal: 13 mg/dL (ref 5–40)

## 2022-08-03 LAB — HEMOGLOBIN A1C
Est. average glucose Bld gHb Est-mCnc: 160 mg/dL
Hgb A1c MFr Bld: 7.2 % — ABNORMAL HIGH (ref 4.8–5.6)

## 2022-08-09 ENCOUNTER — Ambulatory Visit (INDEPENDENT_AMBULATORY_CARE_PROVIDER_SITE_OTHER): Payer: BC Managed Care – PPO | Admitting: Family Medicine

## 2022-08-09 ENCOUNTER — Encounter: Payer: Self-pay | Admitting: Family Medicine

## 2022-08-09 VITALS — BP 117/71 | HR 68 | Temp 97.9°F | Ht 62.0 in | Wt 253.8 lb

## 2022-08-09 DIAGNOSIS — Z1159 Encounter for screening for other viral diseases: Secondary | ICD-10-CM | POA: Diagnosis not present

## 2022-08-09 DIAGNOSIS — E1169 Type 2 diabetes mellitus with other specified complication: Secondary | ICD-10-CM

## 2022-08-09 DIAGNOSIS — E785 Hyperlipidemia, unspecified: Secondary | ICD-10-CM

## 2022-08-09 DIAGNOSIS — E1159 Type 2 diabetes mellitus with other circulatory complications: Secondary | ICD-10-CM | POA: Diagnosis not present

## 2022-08-09 DIAGNOSIS — I152 Hypertension secondary to endocrine disorders: Secondary | ICD-10-CM | POA: Diagnosis not present

## 2022-08-09 MED ORDER — OZEMPIC (2 MG/DOSE) 8 MG/3ML ~~LOC~~ SOPN: 2.0000 mg | PEN_INJECTOR | SUBCUTANEOUS | 4 refills | Status: DC

## 2022-08-09 MED ORDER — PRAVASTATIN SODIUM 10 MG PO TABS: 10.0000 mg | ORAL_TABLET | Freq: Every day | ORAL | 1 refills | Status: DC

## 2022-08-09 MED ORDER — LISINOPRIL-HYDROCHLOROTHIAZIDE 20-25 MG PO TABS: 1.0000 | ORAL_TABLET | Freq: Every day | ORAL | 1 refills | Status: DC

## 2022-08-09 MED ORDER — METFORMIN HCL ER 500 MG PO TB24: 500.0000 mg | ORAL_TABLET | Freq: Every day | ORAL | 1 refills | Status: DC

## 2022-08-09 NOTE — Progress Notes (Signed)
Established Patient Office Visit  Subjective   Patient ID: Virginia Weeks, female    DOB: Jan 01, 1961  Age: 62 y.o. MRN: [id number]  Chief Complaint  Patient presents with   Hypertension   Diabetes    HPI Virginia Weeks is a 62 y.o. female presenting today for follow up of hypertension, hyperlipidemia, diabetes. Hypertension: Patient here for follow-up of elevated blood pressure. She is not exercising currently because her dog has a lots of medical needs and requires constant supervision for the time being.   Pt denies chest pain, SOB, dizziness, edema, syncope, fatigue or heart palpitations. Taking lisinopril-hydrochlorothiazide, reports excellent compliance with treatment. Denies side effects. Hyperlipidemia: tolerating pravastatin well with no myalgias or significant side effects.  The 10-year ASCVD risk score (Arnett DK, et al., 2019) is: 4.5% Diabetes: denies hypoglycemic events, wounds or sores that are not healing well, increased thirst or urination. Denies vision problems, eye exam completed 10/06/2021. Taking metformin and Ozempic as prescribed without any side effects.  She increased her water intake and started a magnesium supplement which has completely alleviated the constipation that she was previously experiencing.    ROS Negative unless otherwise noted in HPI   Objective:     BP 117/71   Pulse 68   Temp 97.9 F (36.6 C) (Oral)   Ht 5\' 2"  (1.575 m)   Wt 253 lb 12 oz (115.1 kg)   SpO2 (!) 9%   BMI 46.41 kg/m   Physical Exam Constitutional:      General: She is not in acute distress.    Appearance: Normal appearance.  HENT:     Head: Normocephalic and atraumatic.  Cardiovascular:     Rate and Rhythm: Normal rate and regular rhythm.     Heart sounds: No murmur heard.    No friction rub. No gallop.  Pulmonary:     Effort: Pulmonary effort is normal. No respiratory distress.     Breath sounds: No wheezing, rhonchi or rales.  Skin:    General: Skin is warm  and dry.  Neurological:     Mental Status: She is alert and oriented to person, place, and time.     Assessment & Plan:  Hypertension associated with diabetes (HCC) Assessment & Plan: BP goal <130/80. Stable.  Continue lisinopril-HCTZ 20-25 mg daily.  Most recent CMP within normal limits.  Will continue to monitor.  Orders: -     Lisinopril-hydroCHLOROthiazide; Take 1 tablet by mouth daily.  Dispense: 90 tablet; Refill: 1  Hyperlipidemia associated with type 2 diabetes mellitus (HCC) Assessment & Plan: Last lipid panel: LDL 68, HDL 54, triglycerides 65. Continue pravastatin 10 mg daily.  Recheck lipid panel and CMP with annual physical labs in 4 months.  Orders: -     Pravastatin Sodium; Take 1 tablet (10 mg total) by mouth daily.  Dispense: 90 tablet; Refill: 1  Type 2 diabetes mellitus with other specified complication, without long-term current use of insulin (HCC) Assessment & Plan: A1c 7.2, decreased from last check.  Continue increased water intake and magnesium supplement for constipation.  Patient is open to increasing dose of Ozempic to meet A1c goal <7.0.increase to Ozempic 2 mg weekly, continue metformin 500 mg daily.  If the increased dose of Ozempic causes constipation again that cannot be relieved with water and magnesium, return to 1 mg Ozempic dose and increase metformin dose.  Patient verbalized understanding and is agreeable to this plan.  Orders: -     metFORMIN HCl ER; Take 1 tablet (500  mg total) by mouth daily with breakfast.  Dispense: 90 tablet; Refill: 1 -     Ozempic (2 MG/DOSE); Inject 2 mg into the skin once a week.  Dispense: 3 mL; Refill: 4  Screening for viral disease -     Hepatitis C antibody; Future -     HIV Antibody (routine testing w rflx); Future  Agreeable to having HIV and hepatitis C screening done with annual lab work.  We are requesting copies of her last colonoscopy results from when she still lived in [place].  Return in about 4 months  (around 12/10/2022) for annual physical, fasting blood work 1 week before.    Melida Quitter, PA

## 2022-08-09 NOTE — Assessment & Plan Note (Signed)
Last lipid panel: LDL 68, HDL 54, triglycerides 65. Continue pravastatin 10 mg daily.  Recheck lipid panel and CMP with annual physical labs in 4 months.

## 2022-08-09 NOTE — Assessment & Plan Note (Signed)
A1c 7.2, decreased from last check.  Continue increased water intake and magnesium supplement for constipation.  Patient is open to increasing dose of Ozempic to meet A1c goal <7.0.increase to Ozempic 2 mg weekly, continue metformin 500 mg daily.  If the increased dose of Ozempic causes constipation again that cannot be relieved with water and magnesium, return to 1 mg Ozempic dose and increase metformin dose.  Patient verbalized understanding and is agreeable to this plan.

## 2022-08-09 NOTE — Assessment & Plan Note (Signed)
BP goal <130/80. Stable.  Continue lisinopril-HCTZ 20-25 mg daily.  Most recent CMP within normal limits.  Will continue to monitor.

## 2022-08-21 ENCOUNTER — Telehealth: Payer: Self-pay | Admitting: *Deleted

## 2022-08-21 NOTE — Telephone Encounter (Signed)
Spoke with pt about colonoscopy, we are trying to retrieve records for this.  Will update patient if we hear back from them.

## 2022-08-25 ENCOUNTER — Encounter (INDEPENDENT_AMBULATORY_CARE_PROVIDER_SITE_OTHER): Payer: Self-pay | Admitting: Family Medicine

## 2022-08-28 ENCOUNTER — Encounter: Payer: Self-pay | Admitting: Family Medicine

## 2022-09-26 ENCOUNTER — Other Ambulatory Visit: Payer: Self-pay | Admitting: Family Medicine

## 2022-09-26 DIAGNOSIS — Z1231 Encounter for screening mammogram for malignant neoplasm of breast: Secondary | ICD-10-CM

## 2022-09-27 ENCOUNTER — Encounter: Payer: Self-pay | Admitting: Family Medicine

## 2022-10-26 LAB — HM DIABETES EYE EXAM

## 2022-11-10 ENCOUNTER — Ambulatory Visit
Admission: RE | Admit: 2022-11-10 | Discharge: 2022-11-10 | Disposition: A | Discharge status: Home / Self Care | Payer: BC Managed Care – PPO | Source: Ambulatory Visit | Attending: Family Medicine

## 2022-11-10 DIAGNOSIS — Z1231 Encounter for screening mammogram for malignant neoplasm of breast: Secondary | ICD-10-CM

## 2022-11-13 ENCOUNTER — Encounter: Payer: Self-pay | Admitting: Family Medicine

## 2022-11-30 ENCOUNTER — Other Ambulatory Visit: Payer: Self-pay

## 2022-11-30 DIAGNOSIS — I152 Hypertension secondary to endocrine disorders: Secondary | ICD-10-CM

## 2022-11-30 DIAGNOSIS — Z0000 Encounter for general adult medical examination without abnormal findings: Secondary | ICD-10-CM

## 2022-11-30 DIAGNOSIS — E1169 Type 2 diabetes mellitus with other specified complication: Secondary | ICD-10-CM

## 2022-12-18 ENCOUNTER — Other Ambulatory Visit: Payer: BC Managed Care – PPO

## 2022-12-18 DIAGNOSIS — E1169 Type 2 diabetes mellitus with other specified complication: Secondary | ICD-10-CM

## 2022-12-18 DIAGNOSIS — Z1159 Encounter for screening for other viral diseases: Secondary | ICD-10-CM

## 2022-12-18 DIAGNOSIS — Z0000 Encounter for general adult medical examination without abnormal findings: Secondary | ICD-10-CM

## 2022-12-18 DIAGNOSIS — E1159 Type 2 diabetes mellitus with other circulatory complications: Secondary | ICD-10-CM

## 2022-12-19 LAB — LIPID PANEL
Chol/HDL Ratio: 2.8 {ratio} (ref 0.0–4.4)
Cholesterol, Total: 160 mg/dL (ref 100–199)
HDL: 57 mg/dL (ref >39)
LDL Chol Calc (NIH): 88 mg/dL (ref 0–99)
Triglycerides: 81 mg/dL (ref 0–149)
VLDL Cholesterol Cal: 15 mg/dL (ref 5–40)

## 2022-12-19 LAB — CBC WITH DIFFERENTIAL/PLATELET
Basophils Absolute: 0 10*3/uL (ref 0.0–0.2)
Basos: 0 %
EOS (ABSOLUTE): 0.2 10*3/uL (ref 0.0–0.4)
Eos: 3 %
Hematocrit: 40.5 % (ref 34.0–46.6)
Hemoglobin: 13.2 g/dL (ref 11.1–15.9)
Immature Grans (Abs): 0 10*3/uL (ref 0.0–0.1)
Immature Granulocytes: 1 %
Lymphocytes Absolute: 2.2 10*3/uL (ref 0.7–3.1)
Lymphs: 25 %
MCH: 27.6 pg (ref 26.6–33.0)
MCHC: 32.6 g/dL (ref 31.5–35.7)
MCV: 85 fL (ref 79–97)
Monocytes Absolute: 0.6 10*3/uL (ref 0.1–0.9)
Monocytes: 7 %
Neutrophils Absolute: 5.7 10*3/uL (ref 1.4–7.0)
Neutrophils: 64 %
Platelets: 247 10*3/uL (ref 150–450)
RBC: 4.78 x10E6/uL (ref 3.77–5.28)
RDW: 13.1 % (ref 11.7–15.4)
WBC: 8.8 10*3/uL (ref 3.4–10.8)

## 2022-12-19 LAB — COMPREHENSIVE METABOLIC PANEL
ALT: 26 [IU]/L (ref 0–32)
AST: 30 [IU]/L (ref 0–40)
Albumin: 3.9 g/dL (ref 3.9–4.9)
Alkaline Phosphatase: 63 [IU]/L (ref 44–121)
BUN/Creatinine Ratio: 20 (ref 12–28)
BUN: 11 mg/dL (ref 8–27)
Bilirubin Total: 0.4 mg/dL (ref 0.0–1.2)
CO2: 24 mmol/L (ref 20–29)
Calcium: 9.2 mg/dL (ref 8.7–10.3)
Chloride: 103 mmol/L (ref 96–106)
Creatinine, Ser: 0.56 mg/dL — ABNORMAL LOW (ref 0.57–1.00)
Globulin, Total: 2.9 g/dL (ref 1.5–4.5)
Glucose: 123 mg/dL — ABNORMAL HIGH (ref 70–99)
Potassium: 3.8 mmol/L (ref 3.5–5.2)
Sodium: 143 mmol/L (ref 134–144)
Total Protein: 6.8 g/dL (ref 6.0–8.5)
eGFR: 104 mL/min/{1.73_m2} (ref >59)

## 2022-12-19 LAB — TSH: TSH: 0.931 u[IU]/mL (ref 0.450–4.500)

## 2022-12-19 LAB — HIV ANTIBODY (ROUTINE TESTING W REFLEX): HIV Screen 4th Generation wRfx: NONREACTIVE

## 2022-12-19 LAB — HEMOGLOBIN A1C
Est. average glucose Bld gHb Est-mCnc: 140 mg/dL
Hgb A1c MFr Bld: 6.5 % — ABNORMAL HIGH (ref 4.8–5.6)

## 2022-12-19 LAB — HEPATITIS C ANTIBODY: Hep C Virus Ab: NONREACTIVE

## 2022-12-25 ENCOUNTER — Encounter: Payer: Self-pay | Admitting: Family Medicine

## 2022-12-25 ENCOUNTER — Ambulatory Visit (INDEPENDENT_AMBULATORY_CARE_PROVIDER_SITE_OTHER): Payer: BC Managed Care – PPO | Admitting: Family Medicine

## 2022-12-25 VITALS — BP 106/68 | HR 78 | Resp 18 | Ht 62.0 in | Wt 246.0 lb

## 2022-12-25 DIAGNOSIS — E785 Hyperlipidemia, unspecified: Secondary | ICD-10-CM

## 2022-12-25 DIAGNOSIS — I152 Hypertension secondary to endocrine disorders: Secondary | ICD-10-CM | POA: Diagnosis not present

## 2022-12-25 DIAGNOSIS — E1159 Type 2 diabetes mellitus with other circulatory complications: Secondary | ICD-10-CM | POA: Diagnosis not present

## 2022-12-25 DIAGNOSIS — E1169 Type 2 diabetes mellitus with other specified complication: Secondary | ICD-10-CM

## 2022-12-25 DIAGNOSIS — Z0000 Encounter for general adult medical examination without abnormal findings: Secondary | ICD-10-CM

## 2022-12-25 DIAGNOSIS — Z1211 Encounter for screening for malignant neoplasm of colon: Secondary | ICD-10-CM

## 2022-12-25 DIAGNOSIS — Z1212 Encounter for screening for malignant neoplasm of rectum: Secondary | ICD-10-CM

## 2022-12-25 MED ORDER — PRAVASTATIN SODIUM 10 MG PO TABS
10.0000 mg | ORAL_TABLET | Freq: Every day | ORAL | 1 refills | Status: DC
Start: 2022-12-25 — End: 2023-07-24

## 2022-12-25 MED ORDER — OZEMPIC (2 MG/DOSE) 8 MG/3ML ~~LOC~~ SOPN: 2.0000 mg | PEN_INJECTOR | SUBCUTANEOUS | 3 refills | Status: DC

## 2022-12-25 MED ORDER — LISINOPRIL-HYDROCHLOROTHIAZIDE 20-25 MG PO TABS: 1.0000 | ORAL_TABLET | Freq: Every day | ORAL | 1 refills | Status: DC

## 2022-12-25 NOTE — Assessment & Plan Note (Signed)
BP goal <130/80. Stable, at goal.  Continue lisinopril-HCTZ 20-25 mg daily.  Most recent CMP within normal limits other than slightly low creatinine.  Will continue to monitor.

## 2022-12-25 NOTE — Progress Notes (Signed)
Complete physical exam  Patient: Virginia Weeks   DOB: [birth year]   62 y.o. Female  MRN: [id number]  Subjective:    Chief Complaint  Patient presents with   Annual Exam    Virginia Weeks is a 62 y.o. female who presents today for a complete physical exam. She reports consuming a general diet.  She recently got a new puppy which has changed her routine.  The puppy, Fredric Mare, is 66 weeks old and has a lot of energy.  She generally feels well. She reports sleeping well aside from being on puppy schedule. She does not have additional problems to discuss today.  She has tolerated the increased dose of Ozempic well and since increasing her fluid intake has not had any issues with constipation.   Most recent fall risk assessment:    08/09/2022   10:12 AM  Fall Risk   Falls in the past year? 0  Number falls in past yr: 0  Injury with Fall? 0  Risk for fall due to : No Fall Risks  Follow up Falls evaluation completed     Most recent depression and anxiety screenings:    08/09/2022   10:12 AM 04/19/2022   11:11 AM  PHQ 2/9 Scores  PHQ - 2 Score 0 0  PHQ- 9 Score 3 6      08/09/2022   10:13 AM 04/19/2022   11:11 AM 11/10/2021   10:57 AM 05/04/2021    8:30 AM  GAD 7 : Generalized Anxiety Score  Nervous, Anxious, on Edge 0 0 1 0  Control/stop worrying 0 0 1 0  Worry too much - different things 0 0 0 0  Trouble relaxing 0 1 1 0  Restless 0 0 0 0  Easily annoyed or irritable 0 0 0 0  Afraid - awful might happen 0 0 0 0  Total GAD 7 Score 0 1 3 0  Anxiety Difficulty Not difficult at all Not difficult at all  Not difficult at all    Patient Active Problem List   Diagnosis Date Noted   Vaginal atrophy 04/19/2022   Morbid obesity with body mass index (BMI) of 40.0 or higher (HCC) 08/03/2021   Type 2 diabetes mellitus with other specified complication (HCC) 01/19/2021   Primary open-angle glaucoma, bilateral, mild stage 01/19/2021   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 01/19/2021   Hypertension associated with diabetes (HCC) 01/19/2021   Hyperlipidemia associated with type 2 diabetes mellitus (HCC) 01/19/2021    Past Surgical History:  Procedure Laterality Date   ABDOMINAL HYSTERECTOMY  10/08/2017   BREAST BIOPSY Left    CESAREAN SECTION  06/18/1980   09/29/1987   Social History   Tobacco Use   Smoking status: Former    Current packs/day: 0.00    Types: Cigarettes    Quit date: 2008    Years since quitting: 16.9    Passive exposure: Never   Smokeless tobacco: Never  Substance Use Topics   Alcohol use: Yes   Drug use: Yes   Family History  Problem Relation Age of Onset   Stroke Mother    Hypertension Mother    Hyperlipidemia Mother    Alcohol abuse Father    Cancer Maternal Grandmother    Breast cancer Maternal Grandmother    Allergies  Allergen Reactions   Codeine     Other reaction(s): itching   Latex     Other reaction(s): skin peeling     Patient Care Team:  Melida Quitter, PA as PCP - General (Family Medicine)   Outpatient Medications Prior to Visit  Medication Sig   glucose blood (ONETOUCH VERIO) test strip 1 each by Other route daily.   latanoprost (XALATAN) 0.005 % ophthalmic solution SMARTSIG:1 Drop(s) In Eye(s) Every Evening   Microlet Lancets MISC Use to check FBS up to 2 times daily   [DISCONTINUED] lisinopril-hydrochlorothiazide (ZESTORETIC) 20-25 MG tablet Take 1 tablet by mouth daily.   [DISCONTINUED] metFORMIN (GLUCOPHAGE-XR) 500 MG 24 hr tablet Take 1 tablet (500 mg total) by mouth daily with breakfast.   [DISCONTINUED] pravastatin (PRAVACHOL) 10 MG tablet Take 1 tablet (10 mg total) by mouth daily.   [DISCONTINUED] Semaglutide, 2 MG/DOSE, (OZEMPIC, 2 MG/DOSE,) 8 MG/3ML SOPN Inject 2 mg into the skin once a week.   No facility-administered medications prior to visit.    Review of Systems  Constitutional:  Negative for chills, fever and malaise/fatigue.  HENT:  Negative for congestion and  hearing loss.   Eyes:  Negative for blurred vision and double vision.  Respiratory:  Negative for cough and shortness of breath.   Cardiovascular:  Negative for chest pain, palpitations and leg swelling.  Gastrointestinal:  Negative for abdominal pain, constipation, diarrhea and heartburn.  Genitourinary:  Negative for frequency and urgency.  Musculoskeletal:  Negative for myalgias and neck pain.  Neurological:  Negative for headaches.  Endo/Heme/Allergies:  Negative for polydipsia.  Psychiatric/Behavioral:  Negative for depression. The patient is not nervous/anxious and does not have insomnia.       Objective:    BP 106/68 (BP Location: Left Arm, Patient Position: Sitting, Cuff Size: Normal)   Pulse 78   Resp 18   Ht 5\' 2"  (1.575 m)   Wt 246 lb (111.6 kg)   LMP  (LMP Unknown)   SpO2 95%   BMI 44.99 kg/m    Physical Exam Constitutional:      General: She is not in acute distress.    Appearance: Normal appearance.  HENT:     Head: Normocephalic and atraumatic.     Right Ear: Tympanic membrane, ear canal and external ear normal.     Left Ear: Tympanic membrane, ear canal and external ear normal.     Nose: Nose normal.     Mouth/Throat:     Mouth: Mucous membranes are moist.     Pharynx: No oropharyngeal exudate or posterior oropharyngeal erythema.  Eyes:     Extraocular Movements: Extraocular movements intact.     Conjunctiva/sclera: Conjunctivae normal.     Pupils: Pupils are equal, round, and reactive to light.  Neck:     Thyroid: No thyroid mass, thyromegaly or thyroid tenderness.  Cardiovascular:     Rate and Rhythm: Normal rate and regular rhythm.     Heart sounds: Normal heart sounds. No murmur heard.    No friction rub. No gallop.  Pulmonary:     Effort: Pulmonary effort is normal. No respiratory distress.     Breath sounds: Normal breath sounds. No wheezing, rhonchi or rales.  Abdominal:     General: Abdomen is flat. Bowel sounds are normal. There is no  distension.     Palpations: There is no mass.     Tenderness: There is no abdominal tenderness. There is no guarding.  Musculoskeletal:        General: Normal range of motion.     Cervical back: Normal range of motion and neck supple.  Lymphadenopathy:     Cervical: No cervical adenopathy.  Skin:  General: Skin is warm and dry.  Neurological:     Mental Status: She is alert and oriented to person, place, and time.     Cranial Nerves: No cranial nerve deficit.     Motor: No weakness.     Deep Tendon Reflexes: Reflexes normal.  Psychiatric:        Mood and Affect: Mood normal.        Assessment & Plan:    Routine Health Maintenance and Physical Exam  Immunization History  Administered Date(s) Administered   Influenza, Seasonal, Injecte, Preservative Fre 11/10/2022   Influenza,inj,Quad PF,6+ Mos 10/12/2021   Influenza-Unspecified 10/09/2020   Moderna Sars-Covid-2 Vaccination 04/04/2019, 05/02/2019, 11/29/2019, 06/20/2020, 10/09/2020, 10/17/2021   Pfizer(Comirnaty)Fall Seasonal Vaccine 12 years and older 11/10/2022   Pneumococcal Conjugate-13 10/23/2017   Pneumococcal Polysaccharide-23 01/13/2019   Tdap 10/23/2017   Zoster Recombinant(Shingrix) 10/11/2018, 01/13/2019    Health Maintenance  Topic Date Due   Diabetic kidney evaluation - Urine ACR  05/05/2022   FOOT EXAM  05/05/2022   Colonoscopy  12/27/2022   Cervical Cancer Screening (HPV/Pap Cotest)  06/17/2023   HEMOGLOBIN A1C  06/18/2023   OPHTHALMOLOGY EXAM  10/26/2023   Diabetic kidney evaluation - eGFR measurement  12/18/2023   MAMMOGRAM  11/09/2024   DTaP/Tdap/Td (2 - Td or Tdap) 10/24/2027   INFLUENZA VACCINE  Completed   COVID-19 Vaccine  Completed   Hepatitis C Screening  Completed   HIV Screening  Completed   Zoster Vaccines- Shingrix  Completed   HPV VACCINES  Aged Out    Reviewed most recent labs including CBC, CMP, lipid panel, A1C, TSH, and vitamin D. All within normal limits/stable from last check  other than creatinine slightly low 0.56. Discussed recommendations for repeat colonoscopy given polyps on most recent.  Referral has been placed to [facility].  Discussed health benefits of physical activity, and encouraged her to engage in regular exercise appropriate for her age and condition.  Wellness examination  Hypertension associated with diabetes (HCC) Assessment & Plan: BP goal <130/80. Stable, at goal.  Continue lisinopril-HCTZ 20-25 mg daily.  Most recent CMP within normal limits other than slightly low creatinine.  Will continue to monitor.  Orders: -     Lisinopril-hydroCHLOROthiazide; Take 1 tablet by mouth daily.  Dispense: 90 tablet; Refill: 1  Hyperlipidemia associated with type 2 diabetes mellitus (HCC) Assessment & Plan: Last lipid panel: LDL 88, HDL 57, triglycerides 81. Continue pravastatin 10 mg daily.  Recheck lipid panel and CMP in 3 months.  Orders: -     Pravastatin Sodium; Take 1 tablet (10 mg total) by mouth daily.  Dispense: 90 tablet; Refill: 1  Type 2 diabetes mellitus with other specified complication, without long-term current use of insulin (HCC) Assessment & Plan: A1c further improved and is now at goal at 6.5.  Continue increased water intake and magnesium supplement for constipation.  Continue Ozempic 2 mg weekly, discontinue metformin 500 mg daily.  If A1c is not at goal on recheck, resume metformin 500 mg daily.  Patient verbalized understanding and is agreeable to this plan.  Orders: -     Ozempic (2 MG/DOSE); Inject 2 mg into the skin once a week.  Dispense: 9 mL; Refill: 3  Screening for colorectal cancer -     Ambulatory referral to Gastroenterology    Return in about 3 months (around 03/25/2023) for follow-up for HTN, HLD, DM, POC A1C and UACR at visit.     Melida Quitter, PA

## 2022-12-25 NOTE — Patient Instructions (Signed)
STOP taking metformin. We will recheck your A1C at your next appointment to make sure that Ozempic alone is controlling your blood sugars!

## 2022-12-25 NOTE — Assessment & Plan Note (Signed)
A1c further improved and is now at goal at 6.5.  Continue increased water intake and magnesium supplement for constipation.  Continue Ozempic 2 mg weekly, discontinue metformin 500 mg daily.  If A1c is not at goal on recheck, resume metformin 500 mg daily.  Patient verbalized understanding and is agreeable to this plan.

## 2022-12-25 NOTE — Assessment & Plan Note (Signed)
Last lipid panel: LDL 88, HDL 57, triglycerides 81. Continue pravastatin 10 mg daily.  Recheck lipid panel and CMP in 3 months.

## 2023-02-22 ENCOUNTER — Encounter: Payer: Self-pay | Admitting: Family Medicine

## 2023-02-23 ENCOUNTER — Telehealth: Payer: Self-pay | Admitting: Family Medicine

## 2023-02-23 NOTE — Telephone Encounter (Signed)
 Copied from CRM [id number]. Topic: Appointments - Appointment Scheduling >> Feb 23, 2023  9:53 AM May Sparks wrote: Ms. Gurganus would like to know is she could fill out new patient documents online. Please advise.

## 2023-02-23 NOTE — Telephone Encounter (Signed)
 Mailed patient new patient forms and appointment reminder to address on file

## 2023-03-26 ENCOUNTER — Encounter: Payer: Self-pay | Admitting: Family Medicine

## 2023-03-28 ENCOUNTER — Ambulatory Visit: Payer: BC Managed Care – PPO | Admitting: Family Medicine

## 2023-05-02 ENCOUNTER — Ambulatory Visit (INDEPENDENT_AMBULATORY_CARE_PROVIDER_SITE_OTHER): Payer: 59 | Admitting: Family Medicine

## 2023-05-02 VITALS — BP 128/76 | HR 72 | Temp 98.7°F | Resp 16 | Ht 62.0 in | Wt 251.0 lb

## 2023-05-02 DIAGNOSIS — E119 Type 2 diabetes mellitus without complications: Secondary | ICD-10-CM

## 2023-05-02 DIAGNOSIS — E1169 Type 2 diabetes mellitus with other specified complication: Secondary | ICD-10-CM

## 2023-05-02 DIAGNOSIS — E1159 Type 2 diabetes mellitus with other circulatory complications: Secondary | ICD-10-CM

## 2023-05-02 DIAGNOSIS — M25561 Pain in right knee: Secondary | ICD-10-CM

## 2023-05-02 DIAGNOSIS — I10 Essential (primary) hypertension: Secondary | ICD-10-CM | POA: Diagnosis not present

## 2023-05-02 DIAGNOSIS — E785 Hyperlipidemia, unspecified: Secondary | ICD-10-CM | POA: Diagnosis not present

## 2023-05-02 DIAGNOSIS — G8929 Other chronic pain: Secondary | ICD-10-CM

## 2023-05-02 DIAGNOSIS — M25562 Pain in left knee: Secondary | ICD-10-CM

## 2023-05-02 DIAGNOSIS — Z7985 Long-term (current) use of injectable non-insulin antidiabetic drugs: Secondary | ICD-10-CM | POA: Diagnosis not present

## 2023-05-02 NOTE — Progress Notes (Signed)
 Patient is here to established care with provider. ~health hx address ~care gaps address

## 2023-05-02 NOTE — Progress Notes (Signed)
 New Patient Office Visit  Subjective    Patient ID: Virginia Weeks, female    DOB: [birth year]  Age: 63 y.o. MRN: [id number]  CC:  Chief Complaint  Patient presents with   Medical Management of Chronic Issues    HPI Virginia Weeks presents to establish care and for review of chronic med issues including diabetes, hypertension, and chronic knee pain. Patient reports med compliance and denies acute complaints.    Outpatient Encounter Medications as of 05/02/2023  Medication Sig   glucose blood (ONETOUCH VERIO) test strip 1 each by Other route daily.   latanoprost (XALATAN) 0.005 % ophthalmic solution SMARTSIG:1 Drop(s) In Eye(s) Every Evening   lisinopril -hydrochlorothiazide  (ZESTORETIC ) 20-25 MG tablet Take 1 tablet by mouth daily.   Microlet Lancets MISC Use to check FBS up to 2 times daily   pravastatin  (PRAVACHOL ) 10 MG tablet Take 1 tablet (10 mg total) by mouth daily.   Semaglutide , 2 MG/DOSE, (OZEMPIC , 2 MG/DOSE,) 8 MG/3ML SOPN Inject 2 mg into the skin once a week.   timolol (TIMOPTIC) 0.5 % ophthalmic solution Place 1 drop into the right eye every morning.   No facility-administered encounter medications on file as of 05/02/2023.    Past Medical History:  Diagnosis Date   Diabetes mellitus without complication (HCC)    Glaucoma    Hypertension     Past Surgical History:  Procedure Laterality Date   ABDOMINAL HYSTERECTOMY  10/08/2017   BREAST BIOPSY Left    CESAREAN SECTION  06/18/1980   09/29/1987    Family History  Problem Relation Age of Onset   Stroke Mother    Hypertension Mother    Hyperlipidemia Mother    Alcohol abuse Father    Cancer Maternal Grandmother    Breast cancer Maternal Grandmother     Social History   Socioeconomic History   Marital status: Married    Spouse name: Sulema Endo   Number of children: 2   Years of education: Not on file   Highest education level: Not on file  Occupational History   Not on file  Tobacco Use   Smoking  status: Former    Current packs/day: 0.00    Types: Cigarettes    Quit date: 2008    Years since quitting: 17.3    Passive exposure: Never   Smokeless tobacco: Never  Vaping Use   Vaping status: Not on file  Substance and Sexual Activity   Alcohol use: Yes   Drug use: Yes   Sexual activity: Yes    Birth control/protection: None  Other Topics Concern   Not on file  Social History Narrative   Not on file   Social Drivers of Health   Financial Resource Strain: Low Risk  (05/02/2023)   Overall Financial Resource Strain (CARDIA)    Difficulty of Paying Living Expenses: Not very hard  Food Insecurity: No Food Insecurity (05/02/2023)   Hunger Vital Sign    Worried About Running Out of Food in the Last Year: Never true    Ran Out of Food in the Last Year: Never true  Transportation Needs: No Transportation Needs (05/02/2023)   PRAPARE - Administrator, Civil Service (Medical): No    Lack of Transportation (Non-Medical): No  Physical Activity: Inactive (05/02/2023)   Exercise Vital Sign    Days of Exercise per Week: 0 days    Minutes of Exercise per Session: 0 min  Stress: No Stress Concern Present (05/02/2023)   [employer] of Occupational Health -  Occupational Stress Questionnaire    Feeling of Stress : Not at all  Social Connections: Unknown (05/02/2023)   Social Connection and Isolation Panel [NHANES]    Frequency of Communication with Friends and Family: Not on file    Frequency of Social Gatherings with Friends and Family: Not on file    Attends Religious Services: More than 4 times per year    Active Member of [employer] or Organizations: Patient unable to answer    Attends Banker Meetings: Never    Marital Status: Married  Catering manager Violence: Not At Risk (05/02/2023)   Humiliation, Afraid, Rape, and Kick questionnaire    Fear of Current or Ex-Partner: No    Emotionally Abused: No    Physically Abused: No    Sexually Abused: No    Review  of Systems  All other systems reviewed and are negative.       Objective   BP 128/76   Pulse 72   Temp 98.7 F (37.1 C) (Oral)   Resp 16   Ht 5\' 2"  (1.575 m)   Wt 251 lb (113.9 kg)   LMP  (LMP Unknown)   SpO2 94%   BMI 45.91 kg/m   Physical Exam Vitals and nursing note reviewed.  Constitutional:      General: She is not in acute distress. Cardiovascular:     Rate and Rhythm: Normal rate and regular rhythm.  Pulmonary:     Effort: Pulmonary effort is normal.     Breath sounds: Normal breath sounds.  Abdominal:     Palpations: Abdomen is soft.     Tenderness: There is no abdominal tenderness.  Neurological:     General: No focal deficit present.     Mental Status: She is alert and oriented to person, place, and time.         Assessment & Plan:   Type 2 diabetes mellitus with other specified complication, without long-term current use of insulin (HCC)  Hypertension associated with diabetes (HCC)  Hyperlipidemia associated with type 2 diabetes mellitus (HCC)  Chronic pain of both knees  Long-term current use of injectable noninsulin antidiabetic medication     Return in about 6 months (around 11/01/2023).   Arlo Lama, MD

## 2023-05-09 ENCOUNTER — Encounter: Payer: Self-pay | Admitting: Family Medicine

## 2023-05-22 ENCOUNTER — Encounter: Payer: Self-pay | Admitting: Family Medicine

## 2023-05-22 ENCOUNTER — Telehealth: Payer: Self-pay | Admitting: Family Medicine

## 2023-05-22 NOTE — Telephone Encounter (Signed)
 Copied from CRM [id number]. Topic: General - Call Back - No Documentation >> May 22, 2023  3:55 PM Virginia Weeks wrote: Patient sent back and front of insurance card through [facility].. The front office needed it.. Attn: angelica littlejohn needed this. Please advise if all is okay. Number on file is good.Virginia Weeks

## 2023-05-25 NOTE — Telephone Encounter (Signed)
 Pro fee billing just requested that insurance information be corrected. Thank you for updating information.

## 2023-07-05 ENCOUNTER — Ambulatory Visit: Payer: Self-pay | Admitting: Family Medicine

## 2023-07-05 ENCOUNTER — Other Ambulatory Visit: Payer: Self-pay | Admitting: Family Medicine

## 2023-07-05 ENCOUNTER — Encounter: Payer: Self-pay | Admitting: Family Medicine

## 2023-07-05 DIAGNOSIS — E1159 Type 2 diabetes mellitus with other circulatory complications: Secondary | ICD-10-CM

## 2023-07-05 NOTE — Telephone Encounter (Signed)
 Hi, Virginia Weeks this pt is not establish here she has been in another office since April

## 2023-07-17 ENCOUNTER — Telehealth: Payer: Self-pay

## 2023-07-17 NOTE — Telephone Encounter (Unsigned)
 Copied from CRM [id number]. Topic: Clinical - Lab/Test Results >> Jul 17, 2023  9:35 AM Tonda B wrote: Reason for CRM: patient is calling to get lab order for appt 07/24/23 please [phone]

## 2023-07-24 ENCOUNTER — Encounter: Payer: Self-pay | Admitting: Family Medicine

## 2023-07-24 ENCOUNTER — Ambulatory Visit: Payer: Self-pay | Admitting: Family Medicine

## 2023-07-24 VITALS — BP 133/80 | HR 87 | Ht 62.0 in | Wt 244.4 lb

## 2023-07-24 DIAGNOSIS — Z7984 Long term (current) use of oral hypoglycemic drugs: Secondary | ICD-10-CM | POA: Diagnosis not present

## 2023-07-24 DIAGNOSIS — E66813 Obesity, class 3: Secondary | ICD-10-CM

## 2023-07-24 DIAGNOSIS — E1169 Type 2 diabetes mellitus with other specified complication: Secondary | ICD-10-CM | POA: Diagnosis not present

## 2023-07-24 DIAGNOSIS — I10 Essential (primary) hypertension: Secondary | ICD-10-CM

## 2023-07-24 DIAGNOSIS — E1159 Type 2 diabetes mellitus with other circulatory complications: Secondary | ICD-10-CM

## 2023-07-24 DIAGNOSIS — Z6841 Body Mass Index (BMI) 40.0 and over, adult: Secondary | ICD-10-CM

## 2023-07-24 DIAGNOSIS — E785 Hyperlipidemia, unspecified: Secondary | ICD-10-CM

## 2023-07-24 LAB — POCT GLYCOSYLATED HEMOGLOBIN (HGB A1C): HbA1c, POC (controlled diabetic range): 6.4 % (ref 0.0–7.0)

## 2023-07-24 MED ORDER — OZEMPIC (2 MG/DOSE) 8 MG/3ML ~~LOC~~ SOPN: 2.0000 mg | PEN_INJECTOR | SUBCUTANEOUS | 3 refills | Status: AC

## 2023-07-24 MED ORDER — PRAVASTATIN SODIUM 10 MG PO TABS: 10.0000 mg | ORAL_TABLET | Freq: Every day | ORAL | 1 refills | Status: DC

## 2023-07-24 MED ORDER — LISINOPRIL-HYDROCHLOROTHIAZIDE 20-25 MG PO TABS: 1.0000 | ORAL_TABLET | Freq: Every day | ORAL | 1 refills | Status: DC

## 2023-07-24 NOTE — Progress Notes (Signed)
 Established Patient Office Visit  Subjective    Patient ID: Virginia Weeks, female    DOB: 1960/10/28  Age: 63 y.o. MRN: [id number]  CC:  Chief Complaint  Patient presents with   Medical Management of Chronic Issues    Pt wants to discuss ozempic      HPI Virginia Weeks presents for routine follow up of chronic med issues including diabetes and hypertension. Patient reports med compliance and denies acute complaints.   Outpatient Encounter Medications as of 07/24/2023  Medication Sig   glucose blood (ONETOUCH VERIO) test strip 1 each by Other route daily.   latanoprost (XALATAN) 0.005 % ophthalmic solution SMARTSIG:1 Drop(s) In Eye(s) Every Evening   Microlet Lancets MISC Use to check FBS up to 2 times daily   timolol (TIMOPTIC) 0.5 % ophthalmic solution Place 1 drop into the right eye every morning.   [DISCONTINUED] lisinopril -hydrochlorothiazide  (ZESTORETIC ) 20-25 MG tablet Take 1 tablet by mouth daily.   [DISCONTINUED] pravastatin  (PRAVACHOL ) 10 MG tablet Take 1 tablet (10 mg total) by mouth daily.   [DISCONTINUED] Semaglutide , 2 MG/DOSE, (OZEMPIC , 2 MG/DOSE,) 8 MG/3ML SOPN Inject 2 mg into the skin once a week.   lisinopril -hydrochlorothiazide  (ZESTORETIC ) 20-25 MG tablet Take 1 tablet by mouth daily.   pravastatin  (PRAVACHOL ) 10 MG tablet Take 1 tablet (10 mg total) by mouth daily.   Semaglutide , 2 MG/DOSE, (OZEMPIC , 2 MG/DOSE,) 8 MG/3ML SOPN Inject 2 mg into the skin once a week.   No facility-administered encounter medications on file as of 07/24/2023.    Past Medical History:  Diagnosis Date   Diabetes mellitus without complication (HCC)    Glaucoma    Hypertension     Past Surgical History:  Procedure Laterality Date   ABDOMINAL HYSTERECTOMY  10/08/2017   BREAST BIOPSY Left    CESAREAN SECTION  06/18/1980   09/29/1987    Family History  Problem Relation Age of Onset   Stroke Mother    Hypertension Mother    Hyperlipidemia Mother    Alcohol abuse Father     Cancer Maternal Grandmother    Breast cancer Maternal Grandmother     Social History   Socioeconomic History   Marital status: Married    Spouse name: Juliane   Number of children: 2   Years of education: Not on file   Highest education level: Not on file  Occupational History   Not on file  Tobacco Use   Smoking status: Former    Current packs/day: 0.00    Types: Cigarettes    Quit date: 2008    Years since quitting: 17.5    Passive exposure: Never   Smokeless tobacco: Never  Vaping Use   Vaping status: Not on file  Substance and Sexual Activity   Alcohol use: Yes   Drug use: Yes   Sexual activity: Yes    Birth control/protection: None  Other Topics Concern   Not on file  Social History Narrative   Not on file   Social Drivers of Health   Financial Resource Strain: Low Risk  (05/02/2023)   Overall Financial Resource Strain (CARDIA)    Difficulty of Paying Living Expenses: Not very hard  Food Insecurity: No Food Insecurity (05/02/2023)   Hunger Vital Sign    Worried About Running Out of Food in the Last Year: Never true    Ran Out of Food in the Last Year: Never true  Transportation Needs: No Transportation Needs (05/02/2023)   PRAPARE - Transportation    Lack of Transportation (  Medical): No    Lack of Transportation (Non-Medical): No  Physical Activity: Inactive (05/02/2023)   Exercise Vital Sign    Days of Exercise per Week: 0 days    Minutes of Exercise per Session: 0 min  Stress: No Stress Concern Present (05/02/2023)   [employer] of Occupational Health - Occupational Stress Questionnaire    Feeling of Stress : Not at all  Social Connections: Unknown (05/02/2023)   Social Connection and Isolation Panel    Frequency of Communication with Friends and Family: Not on file    Frequency of Social Gatherings with Friends and Family: Not on file    Attends Religious Services: More than 4 times per year    Active Member of [employer] or Organizations: Patient unable  to answer    Attends Banker Meetings: Never    Marital Status: Married  Catering manager Violence: Not At Risk (05/02/2023)   Humiliation, Afraid, Rape, and Kick questionnaire    Fear of Current or Ex-Partner: No    Emotionally Abused: No    Physically Abused: No    Sexually Abused: No    Review of Systems  All other systems reviewed and are negative.       Objective    BP 133/80   Pulse 87   Ht 5' 2 (1.575 m)   Wt 244 lb 6.4 oz (110.9 kg)   LMP  (LMP Unknown)   SpO2 96%   BMI 44.70 kg/m   Physical Exam Vitals and nursing note reviewed.  Constitutional:      General: She is not in acute distress.    Appearance: She is obese.  Cardiovascular:     Rate and Rhythm: Normal rate and regular rhythm.  Pulmonary:     Effort: Pulmonary effort is normal.     Breath sounds: Normal breath sounds.  Abdominal:     Palpations: Abdomen is soft.     Tenderness: There is no abdominal tenderness.  Neurological:     General: No focal deficit present.     Mental Status: She is alert and oriented to person, place, and time.         Assessment & Plan:   Type 2 diabetes mellitus with other specified complication, without long-term current use of insulin (HCC) -     POCT glycosylated hemoglobin (Hb A1C) -     Microalbumin / creatinine urine ratio -     Ozempic  (2 MG/DOSE); Inject 2 mg into the skin once a week.  Dispense: 9 mL; Refill: 3  Hypertension associated with diabetes (HCC) -     Lisinopril -hydroCHLOROthiazide ; Take 1 tablet by mouth daily.  Dispense: 90 tablet; Refill: 1  Hyperlipidemia associated with type 2 diabetes mellitus (HCC) -     Pravastatin  Sodium; Take 1 tablet (10 mg total) by mouth daily.  Dispense: 90 tablet; Refill: 1  Class 3 severe obesity due to excess calories with serious comorbidity and body mass index (BMI) of 40.0 to 44.9 in adult     Return in about 6 months (around 01/24/2024) for follow up, chronic med issues.   Tanda Raguel SQUIBB, MD

## 2023-07-25 ENCOUNTER — Ambulatory Visit: Payer: Self-pay | Admitting: Family Medicine

## 2023-07-25 LAB — MICROALBUMIN / CREATININE URINE RATIO
Creatinine, Urine: 173.6 mg/dL
Microalb/Creat Ratio: 11 mg/g{creat} (ref 0–29)
Microalbumin, Urine: 19.3 ug/mL

## 2023-08-28 IMAGING — MG MM DIGITAL SCREENING BILAT W/ TOMO AND CAD
8 series · 8 of 24 positions shown · non-contrast
Comparison: None.
COMPARISON: None.

Addendum:
CLINICAL DATA: Screening.

EXAM:
DIGITAL SCREENING BILATERAL MAMMOGRAM WITH TOMOSYNTHESIS AND CAD
TECHNIQUE: Bilateral screening digital craniocaudal and mediolateral oblique
mammograms were obtained. Bilateral screening digital breast
tomosynthesis was performed. The images were evaluated with
computer-aided detection.

[L MLO synth-2D]
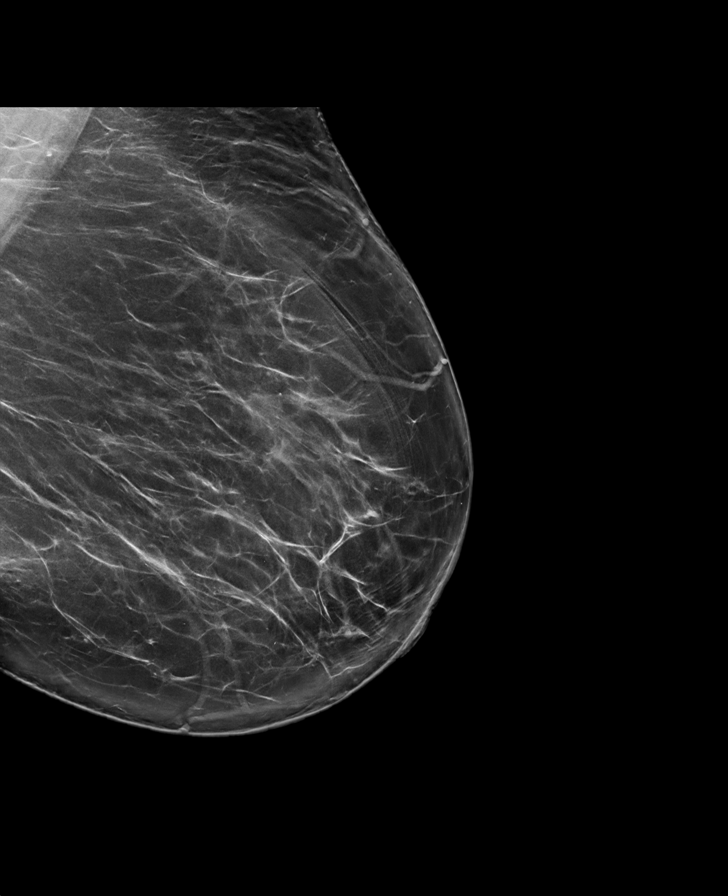

[R CC synth-2D]
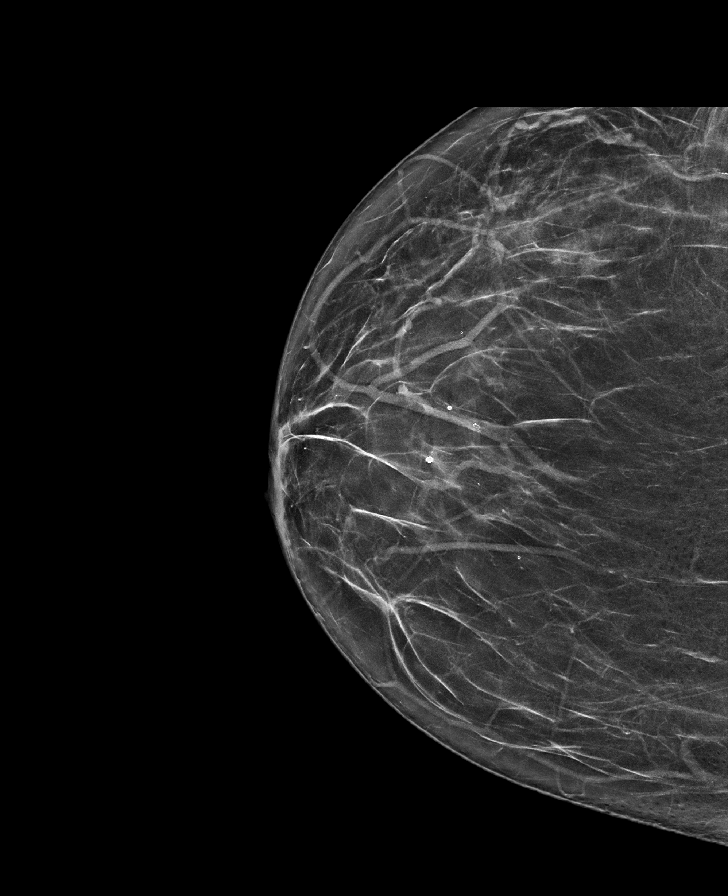

[R MLO synth-2D]
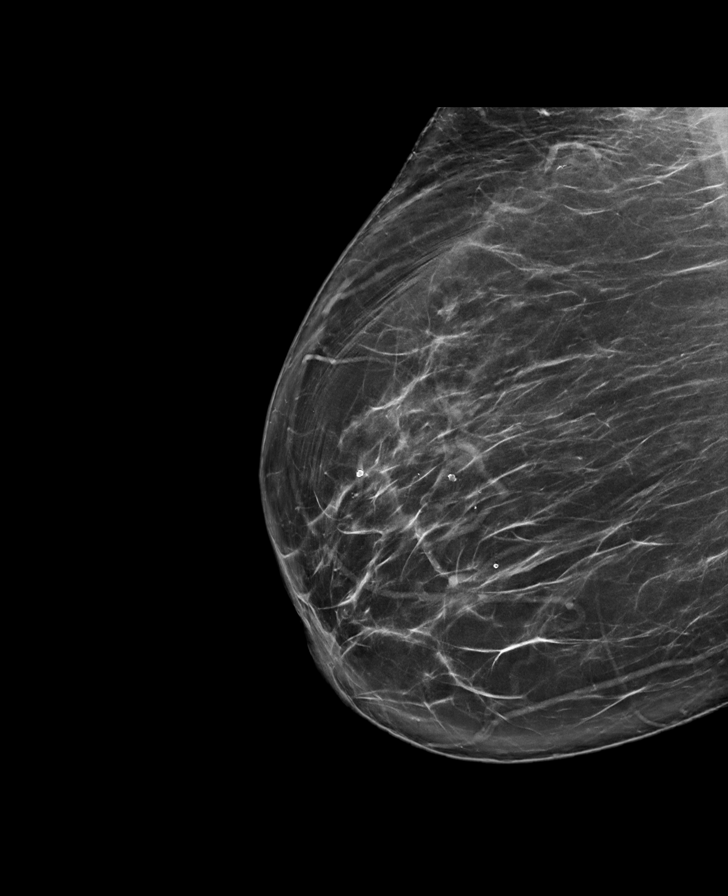

[L CC synth-2D]
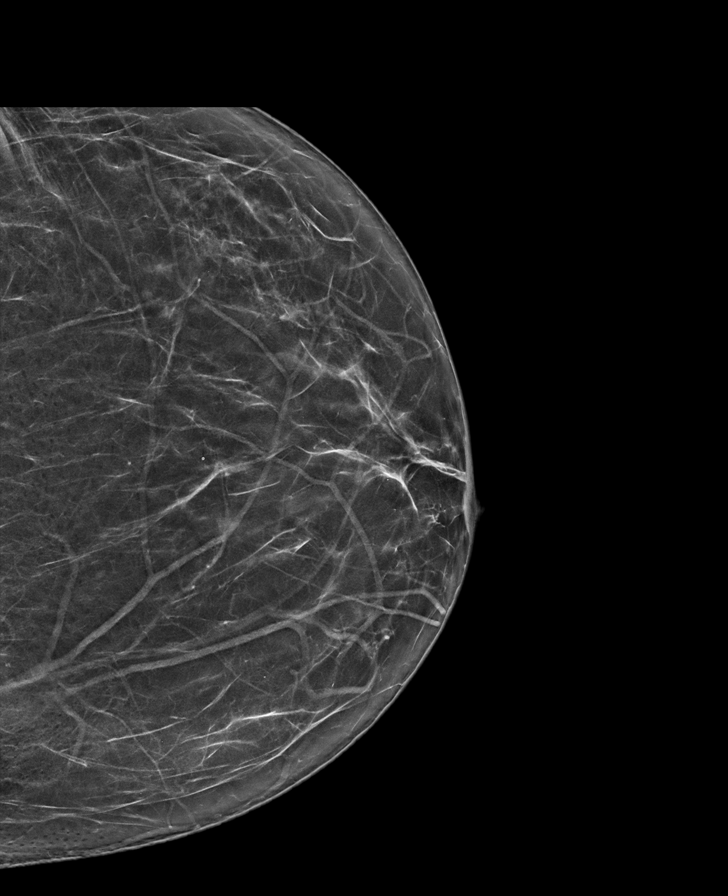

[R CC tomo · tomo slice 37/73.0]
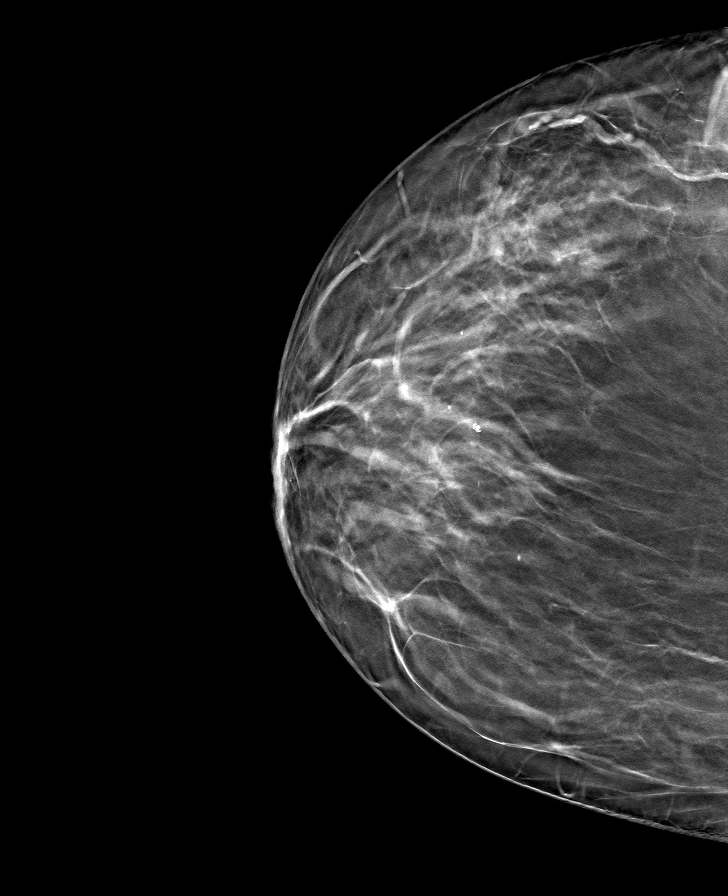

[L MLO tomo · tomo slice 48/95.0]
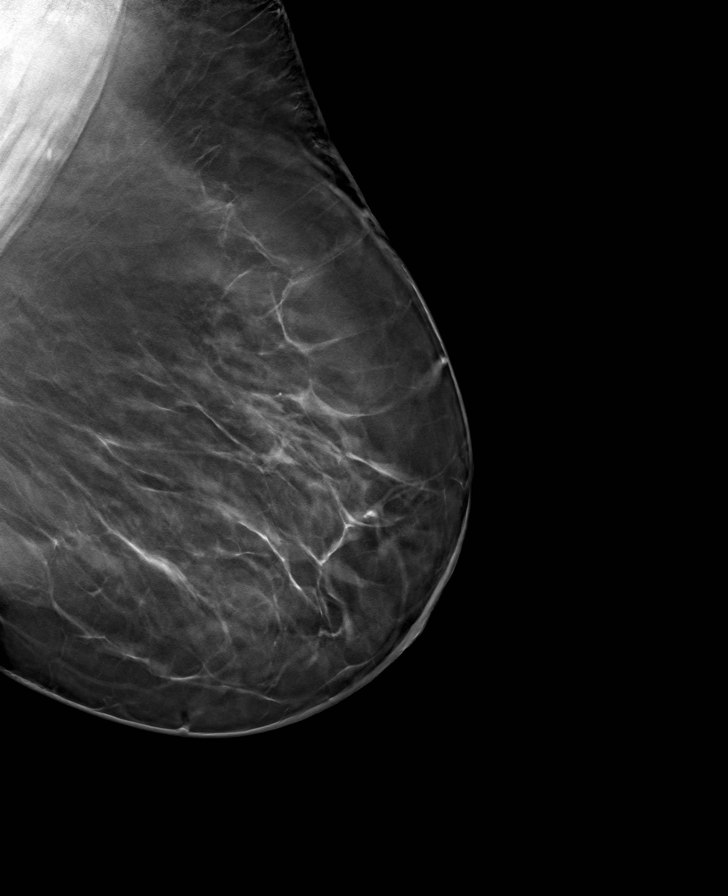

[L CC tomo · tomo slice 37/72.0]
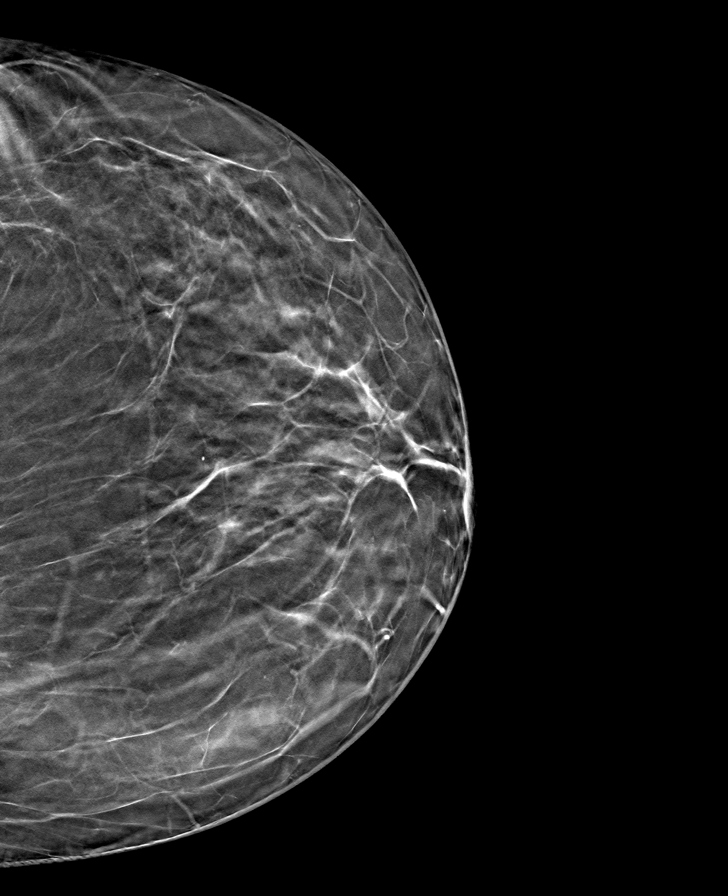

[R MLO tomo · tomo slice 47/92.0]
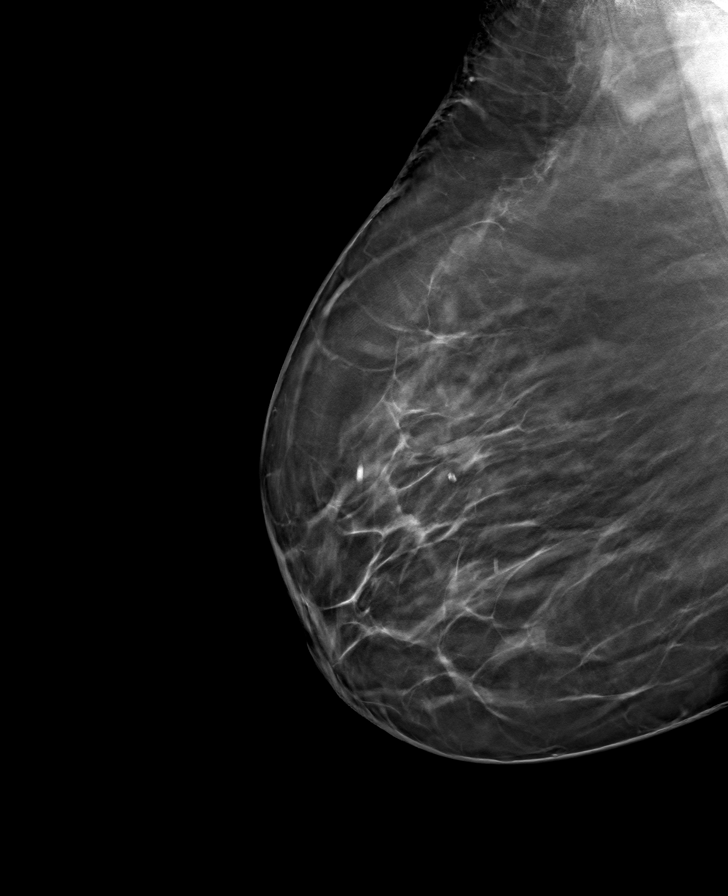

[8 of 24 positions shown; findings below may reference images not displayed]

ACR Breast Density Category b: There are scattered areas of
fibroglandular density.
FINDINGS: There are no findings suspicious for malignancy.
IMPRESSION: No mammographic evidence of malignancy. A result letter of this
screening mammogram will be mailed directly to the patient.

RECOMMENDATION:
Screening mammogram in one year. (Code:3L-M-8KW)

BI-RADS CATEGORY  1: Negative.

ADDENDUM:
Prior images dated 08/03/2018 from [facility] in
[place], [HOSPITAL] have become available for comparison. There is
no evidence of malignancy in either breast.

*** End of Addendum ***
ACR Breast Density Category b: There are scattered areas of
fibroglandular density.
FINDINGS: There are no findings suspicious for malignancy.
IMPRESSION: No mammographic evidence of malignancy. A result letter of this
screening mammogram will be mailed directly to the patient.

RECOMMENDATION:
Screening mammogram in one year. (Code:3L-M-8KW)

BI-RADS CATEGORY  1: Negative.

## 2023-10-16 ENCOUNTER — Other Ambulatory Visit: Payer: Self-pay | Admitting: Family Medicine

## 2023-10-16 DIAGNOSIS — Z1231 Encounter for screening mammogram for malignant neoplasm of breast: Secondary | ICD-10-CM

## 2023-11-12 ENCOUNTER — Ambulatory Visit
Admission: RE | Admit: 2023-11-12 | Discharge: 2023-11-12 | Disposition: A | Discharge status: Home / Self Care | Source: Ambulatory Visit

## 2023-11-12 DIAGNOSIS — Z1231 Encounter for screening mammogram for malignant neoplasm of breast: Secondary | ICD-10-CM

## 2023-11-15 ENCOUNTER — Ambulatory Visit: Payer: Self-pay | Admitting: Family Medicine

## 2023-12-08 ENCOUNTER — Encounter: Payer: Self-pay | Admitting: Family Medicine

## 2024-01-02 ENCOUNTER — Ambulatory Visit: Admitting: Family Medicine

## 2024-01-02 ENCOUNTER — Other Ambulatory Visit: Payer: Self-pay | Admitting: Family Medicine

## 2024-01-02 ENCOUNTER — Encounter: Payer: Self-pay | Admitting: Family Medicine

## 2024-01-02 VITALS — BP 124/75 | HR 66 | Ht 62.0 in | Wt 244.0 lb

## 2024-01-02 DIAGNOSIS — Z7985 Long-term (current) use of injectable non-insulin antidiabetic drugs: Secondary | ICD-10-CM | POA: Diagnosis not present

## 2024-01-02 DIAGNOSIS — Z6841 Body Mass Index (BMI) 40.0 and over, adult: Secondary | ICD-10-CM

## 2024-01-02 DIAGNOSIS — E1159 Type 2 diabetes mellitus with other circulatory complications: Secondary | ICD-10-CM

## 2024-01-02 DIAGNOSIS — I10 Essential (primary) hypertension: Secondary | ICD-10-CM

## 2024-01-02 DIAGNOSIS — Z1211 Encounter for screening for malignant neoplasm of colon: Secondary | ICD-10-CM

## 2024-01-02 DIAGNOSIS — E1169 Type 2 diabetes mellitus with other specified complication: Secondary | ICD-10-CM

## 2024-01-02 DIAGNOSIS — E66813 Obesity, class 3: Secondary | ICD-10-CM

## 2024-01-02 DIAGNOSIS — M25561 Pain in right knee: Secondary | ICD-10-CM | POA: Diagnosis not present

## 2024-01-02 DIAGNOSIS — G8929 Other chronic pain: Secondary | ICD-10-CM

## 2024-01-02 DIAGNOSIS — E785 Hyperlipidemia, unspecified: Secondary | ICD-10-CM

## 2024-01-02 MED ORDER — TRIAMCINOLONE ACETONIDE 40 MG/ML IJ SUSP: 40.0000 mg | Freq: Once | INTRAMUSCULAR | Status: AC

## 2024-01-02 NOTE — Progress Notes (Unsigned)
 Established Patient Office Visit  Subjective    Patient ID: Virginia Weeks, female    DOB: 1960/01/18  Age: 63 y.o. MRN: [id number]  CC:  Chief Complaint  Patient presents with   Medical Management of Chronic Issues    Pt reports she gets nauseas at night due to her ozempic .     HPI Virginia Weeks presents for routine follow up of chronic med issues including diabetes and hypertension. Patient reports med compliance. She reports intermittent nausea from ozempic . She also reports persistent chronic right knee pain  Outpatient Encounter Medications as of 01/02/2024  Medication Sig   glucose blood (ONETOUCH VERIO) test strip 1 each by Other route daily.   lisinopril -hydrochlorothiazide  (ZESTORETIC ) 20-25 MG tablet Take 1 tablet by mouth daily.   Microlet Lancets MISC Use to check FBS up to 2 times daily   pravastatin  (PRAVACHOL ) 10 MG tablet Take 1 tablet (10 mg total) by mouth daily.   Semaglutide , 2 MG/DOSE, (OZEMPIC , 2 MG/DOSE,) 8 MG/3ML SOPN Inject 2 mg into the skin once a week.   latanoprost (XALATAN) 0.005 % ophthalmic solution SMARTSIG:1 Drop(s) In Eye(s) Every Evening   timolol (TIMOPTIC) 0.5 % ophthalmic solution Place 1 drop into the right eye every morning.   Facility-Administered Encounter Medications as of 01/02/2024  Medication   triamcinolone  acetonide (KENALOG -40) injection 40 mg    Past Medical History:  Diagnosis Date   Diabetes mellitus without complication (HCC)    Glaucoma    Hypertension     Past Surgical History:  Procedure Laterality Date   ABDOMINAL HYSTERECTOMY  10/08/2017   BREAST BIOPSY Left    CESAREAN SECTION  06/18/1980   09/29/1987    Family History  Problem Relation Age of Onset   Stroke Mother    Hypertension Mother    Hyperlipidemia Mother    Alcohol abuse Father    Cancer Maternal Grandmother    Breast cancer Maternal Grandmother     Social History   Socioeconomic History   Marital status: Married    Spouse name:  Juliane   Number of children: 2   Years of education: Not on file   Highest education level: Not on file  Occupational History   Not on file  Tobacco Use   Smoking status: Former    Current packs/day: 0.00    Types: Cigarettes    Quit date: 2008    Years since quitting: 17.9    Passive exposure: Never   Smokeless tobacco: Never  Vaping Use   Vaping status: Not on file  Substance and Sexual Activity   Alcohol use: Yes   Drug use: Yes   Sexual activity: Yes    Birth control/protection: None  Other Topics Concern   Not on file  Social History Narrative   Not on file   Social Drivers of Health   Tobacco Use: Medium Risk (01/02/2024)   Patient History    Smoking Tobacco Use: Former    Smokeless Tobacco Use: Never    Passive Exposure: Never  Physicist, Medical Strain: Low Risk (05/02/2023)   Overall Financial Resource Strain (CARDIA)    Difficulty of Paying Living Expenses: Not very hard  Food Insecurity: No Food Insecurity (05/02/2023)   Hunger Vital Sign    Worried About Running Out of Food in the Last Year: Never true    Ran Out of Food in the Last Year: Never true  Transportation Needs: No Transportation Needs (05/02/2023)   PRAPARE - Administrator, Civil Service (Medical):  No    Lack of Transportation (Non-Medical): No  Physical Activity: Inactive (05/02/2023)   Exercise Vital Sign    Days of Exercise per Week: 0 days    Minutes of Exercise per Session: 0 min  Stress: No Stress Concern Present (05/02/2023)   [employer] of Occupational Health - Occupational Stress Questionnaire    Feeling of Stress : Not at all  Social Connections: Unknown (05/02/2023)   Social Connection and Isolation Panel    Frequency of Communication with Friends and Family: Not on file    Frequency of Social Gatherings with Friends and Family: Not on file    Attends Religious Services: More than 4 times per year    Active Member of Clubs or Organizations: Patient unable to  answer    Attends Banker Meetings: Never    Marital Status: Married  Catering Manager Violence: Not At Risk (05/02/2023)   Humiliation, Afraid, Rape, and Kick questionnaire    Fear of Current or Ex-Partner: No    Emotionally Abused: No    Physically Abused: No    Sexually Abused: No  Depression (PHQ2-9): Low Risk (05/02/2023)   Depression (PHQ2-9)    PHQ-2 Score: 0  Alcohol Screen: Low Risk (05/02/2023)   Alcohol Screen    Last Alcohol Screening Score (AUDIT): 0  Housing: Low Risk (05/02/2023)   Housing Stability Vital Sign    Unable to Pay for Housing in the Last Year: No    Number of Times Moved in the Last Year: 0    Homeless in the Last Year: No  Utilities: Not At Risk (05/02/2023)   [facility] Utilities    Threatened with loss of utilities: No  Health Literacy: Not on file    Review of Systems  All other systems reviewed and are negative.       Objective    BP 124/75   Pulse 66   Ht 5' 2 (1.575 m)   Wt 244 lb (110.7 kg)   LMP  (LMP Unknown)   SpO2 96%   BMI 44.63 kg/m   Physical Exam Vitals and nursing note reviewed.  Constitutional:      General: She is not in acute distress.    Appearance: She is obese.  Cardiovascular:     Rate and Rhythm: Normal rate and regular rhythm.  Pulmonary:     Effort: Pulmonary effort is normal.     Breath sounds: Normal breath sounds.  Abdominal:     Palpations: Abdomen is soft.     Tenderness: There is no abdominal tenderness.  Musculoskeletal:     Right knee: Decreased range of motion. Tenderness present.  Neurological:     General: No focal deficit present.     Mental Status: She is alert and oriented to person, place, and time.         Assessment & Plan:   Type 2 diabetes mellitus with other specified complication, without long-term current use of insulin (HCC) -     Comprehensive metabolic panel with GFR -     Hemoglobin A1c  Hypertension associated with diabetes (HCC) -     Comprehensive metabolic  panel with GFR  Hyperlipidemia associated with type 2 diabetes mellitus (HCC) -     Lipid panel  Class 3 severe obesity due to excess calories with serious comorbidity and body mass index (BMI) of 40.0 to 44.9 in adult [facility])  Screening for colon cancer -     Cologuard  Chronic pain of right knee -  Triamcinolone  Acetonide     No follow-ups on file.   Tanda Raguel SQUIBB, MD

## 2024-01-02 NOTE — Telephone Encounter (Unsigned)
 Copied from CRM #[id number]. Topic: Clinical - Medication Refill >> Jan 02, 2024  1:23 PM Delon T wrote: Medication: lisinopril -hydrochlorothiazide  (ZESTORETIC ) 20-25 MG tablet pravastatin  (PRAVACHOL ) 10 MG tablet  Has the patient contacted their pharmacy? No (Agent: If no, request that the patient contact the pharmacy for the refill. If patient does not wish to contact the pharmacy document the reason why and proceed with request.) (Agent: If yes, when and what did the pharmacy advise?)  This is the patient's preferred pharmacy:  [id number] [address] [place], [place] - [address] [place] [address] Phone: [phone] Fax: [phone]  Is this the correct pharmacy for this prescription? Yes If no, delete pharmacy and type the correct one.   Has the prescription been filled recently? Yes  Is the patient out of the medication? Yes  Has the patient been seen for an appointment in the last year OR does the patient have an upcoming appointment? Yes  Can we respond through MyChart? Yes  Agent: Please be advised that Rx refills may take up to 3 business days. We ask that you follow-up with your pharmacy.

## 2024-01-03 ENCOUNTER — Ambulatory Visit: Payer: Self-pay | Admitting: Family Medicine

## 2024-01-03 DIAGNOSIS — R195 Other fecal abnormalities: Secondary | ICD-10-CM

## 2024-01-03 LAB — COMPREHENSIVE METABOLIC PANEL WITH GFR
ALT: 23 IU/L (ref 0–32)
AST: 27 IU/L (ref 0–40)
Albumin: 3.9 g/dL (ref 3.9–4.9)
Alkaline Phosphatase: 61 IU/L (ref 49–135)
BUN/Creatinine Ratio: 15 (ref 12–28)
BUN: 11 mg/dL (ref 8–27)
Bilirubin Total: 0.4 mg/dL (ref 0.0–1.2)
CO2: 26 mmol/L (ref 20–29)
Calcium: 9.6 mg/dL (ref 8.7–10.3)
Chloride: 101 mmol/L (ref 96–106)
Creatinine, Ser: 0.71 mg/dL (ref 0.57–1.00)
Globulin, Total: 3.1 g/dL (ref 1.5–4.5)
Glucose: 104 mg/dL — ABNORMAL HIGH (ref 70–99)
Potassium: 4.4 mmol/L (ref 3.5–5.2)
Sodium: 141 mmol/L (ref 134–144)
Total Protein: 7 g/dL (ref 6.0–8.5)
eGFR: 96 mL/min/1.73 (ref >59)

## 2024-01-03 LAB — LIPID PANEL
Chol/HDL Ratio: 2.8 ratio (ref 0.0–4.4)
Cholesterol, Total: 150 mg/dL (ref 100–199)
HDL: 53 mg/dL (ref >39)
LDL Chol Calc (NIH): 83 mg/dL (ref 0–99)
Triglycerides: 74 mg/dL (ref 0–149)
VLDL Cholesterol Cal: 14 mg/dL (ref 5–40)

## 2024-01-03 LAB — HEMOGLOBIN A1C
Est. average glucose Bld gHb Est-mCnc: 137 mg/dL
Hgb A1c MFr Bld: 6.4 % — ABNORMAL HIGH (ref 4.8–5.6)

## 2024-01-04 MED ORDER — LISINOPRIL-HYDROCHLOROTHIAZIDE 20-25 MG PO TABS: 1.0000 | ORAL_TABLET | Freq: Every day | ORAL | 1 refills | Status: AC

## 2024-01-04 MED ORDER — PRAVASTATIN SODIUM 10 MG PO TABS: 10.0000 mg | ORAL_TABLET | Freq: Every day | ORAL | 3 refills | Status: AC

## 2024-01-04 NOTE — Telephone Encounter (Signed)
 Requested Prescriptions  Pending Prescriptions Disp Refills   lisinopril -hydrochlorothiazide  (ZESTORETIC ) 20-25 MG tablet 90 tablet 1    Sig: Take 1 tablet by mouth daily.     Cardiovascular:  ACEI + Diuretic Combos Passed - 01/04/2024 10:39 PM      Passed - Na in normal range and within 180 days    Sodium  Date Value Ref Range Status  01/02/2024 141 134 - 144 mmol/L Final         Passed - K in normal range and within 180 days    Potassium  Date Value Ref Range Status  01/02/2024 4.4 3.5 - 5.2 mmol/L Final         Passed - Cr in normal range and within 180 days    Creatinine, Ser  Date Value Ref Range Status  01/02/2024 0.71 0.57 - 1.00 mg/dL Final   Creatinine, POC  Date Value Ref Range Status  05/04/2021 200 mg/dL Final         Passed - eGFR is 30 or above and within 180 days    eGFR  Date Value Ref Range Status  01/02/2024 96 >59 mL/min/1.73 Final         Passed - Patient is not pregnant      Passed - Last BP in normal range    BP Readings from Last 1 Encounters:  01/02/24 124/75         Passed - Valid encounter within last 6 months    Recent Outpatient Visits           2 days ago Type 2 diabetes mellitus with other specified complication, without long-term current use of insulin (HCC)   [facility] [facility], Raguel, MD   5 months ago Type 2 diabetes mellitus with other specified complication, without long-term current use of insulin (HCC)   [place] [facility], Raguel, MD   8 months ago Type 2 diabetes mellitus with other specified complication, without long-term current use of insulin [facility])   [place] [facility], Raguel, MD               pravastatin  (PRAVACHOL ) 10 MG tablet 90 tablet 3    Sig: Take 1 tablet (10 mg total) by mouth daily.     Cardiovascular:  Antilipid - Statins Failed - 01/04/2024 10:39 PM      Failed - Lipid Panel in normal range within the  last 12 months    Cholesterol, Total  Date Value Ref Range Status  01/02/2024 150 100 - 199 mg/dL Final   LDL Chol Calc (NIH)  Date Value Ref Range Status  01/02/2024 83 0 - 99 mg/dL Final   HDL  Date Value Ref Range Status  01/02/2024 53 >39 mg/dL Final   Triglycerides  Date Value Ref Range Status  01/02/2024 74 0 - 149 mg/dL Final         Passed - Patient is not pregnant      Passed - Valid encounter within last 12 months    Recent Outpatient Visits           2 days ago Type 2 diabetes mellitus with other specified complication, without long-term current use of insulin (HCC)   [place] [facility], Raguel, MD   5 months ago Type 2 diabetes mellitus with other specified complication, without long-term current use of insulin (HCC)   [place] [facility] T[facility] Raguel, MD   8 months ago Type 2 diabetes mellitus with other specified complication, without long-term current use of insulin [facility])   [place] [facility], MD

## 2024-01-30 LAB — COLOGUARD: COLOGUARD: POSITIVE — AB

## 2024-01-31 ENCOUNTER — Encounter: Payer: Self-pay | Admitting: Family Medicine

## 2024-01-31 NOTE — Progress Notes (Signed)
 Patient reviewed lab results and provider recommendations via MyChart

## 2024-07-02 ENCOUNTER — Ambulatory Visit: Payer: Self-pay | Admitting: Family Medicine
